# Patient Record
Sex: Male | Born: 1955 | Race: Black or African American | Hispanic: No | Marital: Single | Smoking: Former smoker
Health system: Southern US, Community
[De-identification: ages and names within clinical notes are randomized; demographics above are authoritative.]

## PROBLEM LIST (undated history)

## (undated) DIAGNOSIS — E785 Hyperlipidemia, unspecified: Secondary | ICD-10-CM

## (undated) DIAGNOSIS — I10 Essential (primary) hypertension: Secondary | ICD-10-CM

## (undated) DIAGNOSIS — J449 Chronic obstructive pulmonary disease, unspecified: Secondary | ICD-10-CM

## (undated) HISTORY — DX: Essential (primary) hypertension: I10

## (undated) HISTORY — DX: Chronic obstructive pulmonary disease, unspecified: J44.9

## (undated) HISTORY — DX: Hyperlipidemia, unspecified: E78.5

## (undated) HISTORY — PX: OTHER SURGICAL HISTORY: SHX169

---

## 1997-11-22 ENCOUNTER — Ambulatory Visit (HOSPITAL_COMMUNITY): Admission: RE | Admit: 1997-11-22 | Discharge: 1997-11-22 | Payer: Self-pay | Admitting: Endocrinology

## 2015-05-17 ENCOUNTER — Ambulatory Visit (HOSPITAL_COMMUNITY)
Admission: RE | Admit: 2015-05-17 | Discharge: 2015-05-17 | Disposition: A | Discharge status: Home / Self Care | Payer: Medicaid Other | Attending: Psychiatry | Admitting: Psychiatry

## 2015-05-17 DIAGNOSIS — F3289 Other specified depressive episodes: Secondary | ICD-10-CM | POA: Insufficient documentation

## 2015-05-17 DIAGNOSIS — F1910 Other psychoactive substance abuse, uncomplicated: Secondary | ICD-10-CM | POA: Insufficient documentation

## 2015-05-17 DIAGNOSIS — F419 Anxiety disorder, unspecified: Secondary | ICD-10-CM | POA: Diagnosis not present
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
## 2015-06-16 ENCOUNTER — Other Ambulatory Visit: Payer: Self-pay | Admitting: Licensed Clinical Social Worker

## 2015-06-23 ENCOUNTER — Ambulatory Visit (INDEPENDENT_AMBULATORY_CARE_PROVIDER_SITE_OTHER): Payer: Self-pay | Admitting: Licensed Clinical Social Worker

## 2015-06-23 DIAGNOSIS — F410 Panic disorder [episodic paroxysmal anxiety] without agoraphobia: Secondary | ICD-10-CM

## 2015-06-23 DIAGNOSIS — F329 Major depressive disorder, single episode, unspecified: Secondary | ICD-10-CM

## 2015-06-23 DIAGNOSIS — F32A Depression, unspecified: Secondary | ICD-10-CM

## 2015-06-23 NOTE — Progress Notes (Signed)
   THERAPY PROGRESS NOTE  Session Time: 60min  Participation Level: Active  Behavioral Response: CasualAlertEuthymic  Type of Therapy: Individual Therapy  Treatment Goals addressed: Coping  Interventions: Motivational Interviewing and Supportive  Summary: Glenn Allison is a 60 y.o. male who presents with a euthymic mood and appropriate affect. He reported that he is seeking counseling at this time because of long-term depression that has worsened over the past three months. 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 He expressed his sadness and grief around her death while simultaneously dealing with the stress of his relationship. Glenn Allison shared that he lost several jobs and girlfriends due to his heavy caretaking responsibilities. He reported that his house is being foreclosed on and that he is "basically homeless." He shared that about one month ago, he became so depressed and hopeless that he was planning to kill himself; his girlfriend intervened and [facility]. He was not held at the hospital, but seeing several of his family members there to support him made him realize that he did have a reason to live. He reported that currently he does not have any suicidal ideation. He endorsed symptoms including anhedonia, sudden weight loss (20 pounds in last 2 months), sleep disturbance, feelings of worthlessness and shame, fatigue, and problems concentrating. Glenn Allison shared that over the past year, he has also begun experiencing panic attacks up to 4 times per week. He described these as including hyperventilation, racing heart, lightheadedness, shaking, sweating, and fear. He reported that despite his difficult circumstances, he is feeling very hopeful about his future and wants to work on getting his life back together.    Suicidal/Homicidal: Nowithout intent/plan  Therapist  Response: LCSW utilized supportive counseling techniques throughout the session in order to validate emotions and encourage open expression of emotion. LCSW began the clinical assessment but was unable to finish due to time constraints.  Plan: Return again in 2 weeks.  Diagnosis: Axis I: See current hospital problem list    Axis II: No diagnosis    Nilda Simmeratosha Kemarion Abbey, LCSW 06/23/2015

## 2015-07-05 ENCOUNTER — Encounter: Payer: Self-pay | Admitting: Internal Medicine

## 2015-07-05 ENCOUNTER — Ambulatory Visit (INDEPENDENT_AMBULATORY_CARE_PROVIDER_SITE_OTHER): Payer: Self-pay | Admitting: Internal Medicine

## 2015-07-05 VITALS — BP 172/100 | HR 88 | Resp 20 | Ht 70.0 in | Wt 156.0 lb

## 2015-07-05 DIAGNOSIS — F410 Panic disorder [episodic paroxysmal anxiety] without agoraphobia: Secondary | ICD-10-CM

## 2015-07-05 DIAGNOSIS — J449 Chronic obstructive pulmonary disease, unspecified: Secondary | ICD-10-CM

## 2015-07-05 DIAGNOSIS — R5383 Other fatigue: Secondary | ICD-10-CM

## 2015-07-05 DIAGNOSIS — F32A Depression, unspecified: Secondary | ICD-10-CM | POA: Insufficient documentation

## 2015-07-05 DIAGNOSIS — I10 Essential (primary) hypertension: Secondary | ICD-10-CM

## 2015-07-05 DIAGNOSIS — F329 Major depressive disorder, single episode, unspecified: Secondary | ICD-10-CM

## 2015-07-05 DIAGNOSIS — F419 Anxiety disorder, unspecified: Secondary | ICD-10-CM

## 2015-07-05 MED ORDER — CITALOPRAM HYDROBROMIDE 10 MG PO TABS: 10.0000 mg | ORAL_TABLET | Freq: Every day | ORAL | Status: DC

## 2015-07-05 MED ORDER — ALBUTEROL SULFATE HFA 108 (90 BASE) MCG/ACT IN AERS: 2.0000 | INHALATION_SPRAY | Freq: Four times a day (QID) | RESPIRATORY_TRACT | Status: DC | PRN

## 2015-07-05 MED ORDER — FLUTICASONE PROPIONATE HFA 220 MCG/ACT IN AERO: 2.0000 | INHALATION_SPRAY | Freq: Two times a day (BID) | RESPIRATORY_TRACT | Status: DC

## 2015-07-05 MED ORDER — METOPROLOL TARTRATE 25 MG PO TABS: 25.0000 mg | ORAL_TABLET | Freq: Two times a day (BID) | ORAL | Status: DC

## 2015-07-05 MED ORDER — BUDESONIDE-FORMOTEROL FUMARATE 160-4.5 MCG/ACT IN AERO: 2.0000 | INHALATION_SPRAY | Freq: Two times a day (BID) | RESPIRATORY_TRACT | Status: DC
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
## 2015-07-05 NOTE — Patient Instructions (Signed)
Can google "advance directives, [place]"  And bring up form from Secretary of State. Print and fill out Or can go to "5 wishes"  Which is also in Spanish and fill out--this costs $5--perhaps easier to use. Designate a Medical Power of Attorney to speak for you if you are unable to speak for yourself when ill or injured  

## 2015-07-06 ENCOUNTER — Other Ambulatory Visit: Payer: Self-pay | Admitting: Internal Medicine

## 2015-07-06 ENCOUNTER — Telehealth: Payer: Self-pay | Admitting: Internal Medicine

## 2015-07-06 DIAGNOSIS — J449 Chronic obstructive pulmonary disease, unspecified: Secondary | ICD-10-CM

## 2015-07-06 LAB — CBC WITH DIFFERENTIAL/PLATELET
BASOS: 0 %
Basophils Absolute: 0 10*3/uL (ref 0.0–0.2)
EOS (ABSOLUTE): 0.1 10*3/uL (ref 0.0–0.4)
EOS: 1 %
HEMATOCRIT: 46.9 % (ref 37.5–51.0)
HEMOGLOBIN: 15.3 g/dL (ref 12.6–17.7)
IMMATURE GRANS (ABS): 0 10*3/uL (ref 0.0–0.1)
IMMATURE GRANULOCYTES: 0 %
LYMPHS: 26 %
Lymphocytes Absolute: 2.4 10*3/uL (ref 0.7–3.1)
MCH: 30 pg (ref 26.6–33.0)
MCHC: 32.6 g/dL (ref 31.5–35.7)
MCV: 92 fL (ref 79–97)
Monocytes Absolute: 0.8 10*3/uL (ref 0.1–0.9)
Monocytes: 9 %
NEUTROS PCT: 64 %
Neutrophils Absolute: 5.9 10*3/uL (ref 1.4–7.0)
Platelets: 336 10*3/uL (ref 150–379)
RBC: 5.1 x10E6/uL (ref 4.14–5.80)
RDW: 14.6 % (ref 12.3–15.4)
WBC: 9.2 10*3/uL (ref 3.4–10.8)

## 2015-07-06 LAB — COMPREHENSIVE METABOLIC PANEL
A/G RATIO: 1.7 (ref 1.2–2.2)
ALBUMIN: 4.4 g/dL (ref 3.5–5.5)
ALT: 14 IU/L (ref 0–44)
AST: 12 IU/L (ref 0–40)
Alkaline Phosphatase: 80 IU/L (ref 39–117)
BUN / CREAT RATIO: 17 (ref 9–20)
BUN: 14 mg/dL (ref 6–24)
Bilirubin Total: 0.6 mg/dL (ref 0.0–1.2)
CALCIUM: 10 mg/dL (ref 8.7–10.2)
CO2: 26 mmol/L (ref 18–29)
Chloride: 102 mmol/L (ref 96–106)
Creatinine, Ser: 0.84 mg/dL (ref 0.76–1.27)
GFR, EST AFRICAN AMERICAN: 112 mL/min/{1.73_m2} (ref >59)
GFR, EST NON AFRICAN AMERICAN: 97 mL/min/{1.73_m2} (ref >59)
GLOBULIN, TOTAL: 2.6 g/dL (ref 1.5–4.5)
Glucose: 87 mg/dL (ref 65–99)
POTASSIUM: 4.7 mmol/L (ref 3.5–5.2)
Sodium: 141 mmol/L (ref 134–144)
TOTAL PROTEIN: 7 g/dL (ref 6.0–8.5)

## 2015-07-06 LAB — TSH: TSH: 1.69 u[IU]/mL (ref 0.450–4.500)

## 2015-07-06 MED ORDER — BUDESONIDE-FORMOTEROL FUMARATE 160-4.5 MCG/ACT IN AERO: 2.0000 | INHALATION_SPRAY | Freq: Two times a day (BID) | RESPIRATORY_TRACT | Status: DC

## 2015-07-06 MED ORDER — ALBUTEROL SULFATE HFA 108 (90 BASE) MCG/ACT IN AERS: 2.0000 | INHALATION_SPRAY | Freq: Four times a day (QID) | RESPIRATORY_TRACT | Status: DC | PRN

## 2015-07-06 NOTE — Telephone Encounter (Signed)
[facility] to inform us patient had received inhaler samples from them yesterday but they just found out that patient was approved for Medicaid on 07/01/15 and [facility] would not be able to help [facility] any longer. Medicaid ID # [id number] L

## 2015-07-06 NOTE — Telephone Encounter (Signed)
Patient informed and would like to have [facility] to [facility] [address].

## 2015-07-07 ENCOUNTER — Other Ambulatory Visit: Payer: Self-pay | Admitting: Licensed Clinical Social Worker

## 2015-07-14 ENCOUNTER — Ambulatory Visit (INDEPENDENT_AMBULATORY_CARE_PROVIDER_SITE_OTHER): Payer: Medicaid Other | Admitting: Licensed Clinical Social Worker

## 2015-07-14 DIAGNOSIS — F410 Panic disorder [episodic paroxysmal anxiety] without agoraphobia: Secondary | ICD-10-CM

## 2015-07-14 DIAGNOSIS — F329 Major depressive disorder, single episode, unspecified: Secondary | ICD-10-CM | POA: Diagnosis not present

## 2015-07-14 DIAGNOSIS — F32A Depression, unspecified: Secondary | ICD-10-CM
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
## 2015-07-16 ENCOUNTER — Encounter: Payer: Self-pay | Admitting: Internal Medicine

## 2015-07-16 DIAGNOSIS — J449 Chronic obstructive pulmonary disease, unspecified: Secondary | ICD-10-CM | POA: Insufficient documentation

## 2015-07-18 ENCOUNTER — Ambulatory Visit (INDEPENDENT_AMBULATORY_CARE_PROVIDER_SITE_OTHER): Payer: Medicaid Other | Admitting: Internal Medicine

## 2015-07-18 ENCOUNTER — Encounter: Payer: Self-pay | Admitting: Internal Medicine

## 2015-07-18 VITALS — BP 158/94 | HR 72 | Resp 18 | Ht 70.0 in | Wt 157.5 lb

## 2015-07-18 DIAGNOSIS — J449 Chronic obstructive pulmonary disease, unspecified: Secondary | ICD-10-CM

## 2015-07-18 DIAGNOSIS — I10 Essential (primary) hypertension: Secondary | ICD-10-CM

## 2015-07-18 DIAGNOSIS — F329 Major depressive disorder, single episode, unspecified: Secondary | ICD-10-CM | POA: Diagnosis not present

## 2015-07-18 DIAGNOSIS — F32A Depression, unspecified: Secondary | ICD-10-CM

## 2015-07-18 DIAGNOSIS — F410 Panic disorder [episodic paroxysmal anxiety] without agoraphobia: Secondary | ICD-10-CM | POA: Diagnosis not present

## 2015-07-18 DIAGNOSIS — H547 Unspecified visual loss: Secondary | ICD-10-CM

## 2015-07-18 MED ORDER — CITALOPRAM HYDROBROMIDE 10 MG PO TABS: 10.0000 mg | ORAL_TABLET | Freq: Every day | ORAL | Status: DC

## 2015-07-18 MED ORDER — METOPROLOL TARTRATE 25 MG PO TABS
ORAL_TABLET | ORAL | Status: DC
Start: 2015-07-18 — End: 2015-09-14

## 2015-07-18 NOTE — Patient Instructions (Addendum)
Decrease Symbicort to 1 puff twice daily if doing well in 1 month or sooner if raspiness gets worse.  Gargle after using Symbicort.  Call in blood pressures in 2 weeks.

## 2015-07-18 NOTE — Progress Notes (Signed)
Subjective:    Patient ID: Glenn Allison, male    DOB: 1956/01/11, 60 y.o.   MRN: [id number]  HPI   1.  Panic Attacks/Depression:  Feeling much better.  No side effects with Citalopram.  [facility], Dr. Roselle Locus Aflatooni, changed from Hydroxyzine to Clonazepam 0.5 mg 1/2 tabs twice daily as needed.  Also, given a prescription for Citalopram 20 mg when he did not have the 10 mg bottle with him at the psych visit.    2. COPD:  Breathing significantly better.  Is a bit raspy with the Symbicort, though brushing teeth and tongue after use.  [facility] did not have the Flovent, but we were able to switch to Symbicort.   Questions about screening for lung cancer came up as patient getting ready to leave.    3.  Essential  Hypertension: Tolerating Metoprolol 25 mg twice daily well.  Getting similar bps as we did today at home.   Current outpatient prescriptions:  .  albuterol (PROVENTIL HFA;VENTOLIN HFA) 108 (90 Base) MCG/ACT inhaler, Inhale 2 puffs into the lungs every 6 (six) hours as needed for wheezing or shortness of breath., Disp: 1 Inhaler, Rfl: 2 .  budesonide-formoterol (SYMBICORT) 160-4.5 MCG/ACT inhaler, Inhale 2 puffs into the lungs 2 (two) times daily., Disp: 1 Inhaler, Rfl: 11 .  citalopram (CELEXA) 10 MG tablet, Take 1 tablet (10 mg total) by mouth daily., Disp: 30 tablet, Rfl: 2 .  clonazePAM (KLONOPIN) 0.5 MG tablet, Take 0.5 mg by mouth 2 (two) times daily as needed for anxiety., Disp: , Rfl:  .  metoprolol tartrate (LOPRESSOR) 25 MG tablet, Take 1 tablet (25 mg total) by mouth 2 (two) times daily., Disp: 60 tablet, Rfl: 11 .  hydrOXYzine (VISTARIL) 25 MG capsule, Take 25 mg by mouth 3 (three) times daily as needed for anxiety (at bedtime). Reported on 07/18/2015, Disp: , Rfl:    No Known Allergies    Review of Systems     Objective:   Physical Exam HEENT:  Some dental decay, throat and mouth without white exudate or erythema Neck:  Supple, No adenopathy. Chest:  CTA, but does have  barrel chest and decreased BS CV:  RRR without murmur or rub, radial pulses normal and equal LE:  No edema, DP pulses normal and equal       Assessment & Plan:  1.  Panic Disorder/Depression:  Continue on Citalopram at 10 mg.  Discussed utilizing Clonazepam only as needed twice daily rather than just using regularly.  Hopefully, can wean off as his SSRI becomes therapeutic. Follow up in 4 weeks to see if need to increase Citalopram.  2.  COPD:  This is where patient seems to be deriving significant benefit.  So much so that he has been able to increase his physical activity to a large degree.  If doing well in another 2 weeks, to wean his Symbicort dose to 1 puff twice daily.  To gargle after each use.  May decrease dose if his voice continues to worsen.  3.  Essential Hypertension:  Increase Metoprolol to 37.5 mg twice daily.  His partner to call in his blood pressures in a couple of weeks.  4.  History of tobacco abuse.  Quit just 3 years ago and smoked for about 40 years.  Need to get a better idea of how many packs per day before ordering screening CT.  5.  Decreased visual acuity:  They will let me know who he would like a referral, though  discussed he should be able to call and find out what optometrists take Medicaid.  6.  Dental concerns:  Did not give him the handout on dentists who take Medicaid.  Will send out.

## 2015-07-25 ENCOUNTER — Telehealth: Payer: Self-pay | Admitting: Internal Medicine

## 2015-07-25 DIAGNOSIS — H547 Unspecified visual loss: Secondary | ICD-10-CM

## 2015-07-25 NOTE — Telephone Encounter (Signed)
Patient called to inquire about a vision referral.  Wants to know if he can have the doctor order him a referral for vision.  Not seeing as well, other wise feels much better.

## 2015-07-28 NOTE — Telephone Encounter (Signed)
Patient came in for a follow up on 07/18/15 and medications were sent to pharmacy

## 2015-08-02 ENCOUNTER — Other Ambulatory Visit: Payer: No Typology Code available for payment source | Admitting: Licensed Clinical Social Worker

## 2015-08-02 ENCOUNTER — Encounter: Payer: Self-pay | Admitting: Internal Medicine

## 2015-08-03 ENCOUNTER — Telehealth: Payer: Self-pay | Admitting: Internal Medicine

## 2015-08-03 NOTE — Telephone Encounter (Signed)
Patient called requesting medication refill for Klonopin 0.5 mg tablet.

## 2015-08-05 ENCOUNTER — Telehealth: Payer: Self-pay | Admitting: Internal Medicine

## 2015-08-05 MED ORDER — CLONAZEPAM 0.5 MG PO TABS: 0.5000 mg | ORAL_TABLET | Freq: Two times a day (BID) | ORAL | Status: DC | PRN

## 2015-08-05 NOTE — Telephone Encounter (Signed)
Patient states he is no longer taking as much of the clonazepam unless it is a really bad day he will take half a tablet. He is no longer going to [facility] and only doing counseling with Natosha.

## 2015-08-05 NOTE — Telephone Encounter (Signed)
Rx ready for patient to pick up. Patient informed

## 2015-08-05 NOTE — Telephone Encounter (Signed)
Rx picked up by patient today. 

## 2015-08-11 ENCOUNTER — Other Ambulatory Visit: Payer: Medicaid Other | Admitting: Licensed Clinical Social Worker

## 2015-08-15 ENCOUNTER — Ambulatory Visit (INDEPENDENT_AMBULATORY_CARE_PROVIDER_SITE_OTHER): Payer: Medicaid Other | Admitting: Licensed Clinical Social Worker

## 2015-08-15 ENCOUNTER — Ambulatory Visit (INDEPENDENT_AMBULATORY_CARE_PROVIDER_SITE_OTHER): Payer: Medicaid Other | Admitting: Internal Medicine

## 2015-08-15 ENCOUNTER — Encounter: Payer: Self-pay | Admitting: Internal Medicine

## 2015-08-15 VITALS — BP 140/90 | HR 60 | Temp 98.3°F | Resp 20 | Ht 70.0 in | Wt 159.0 lb

## 2015-08-15 DIAGNOSIS — F410 Panic disorder [episodic paroxysmal anxiety] without agoraphobia: Secondary | ICD-10-CM | POA: Diagnosis not present

## 2015-08-15 DIAGNOSIS — F32A Depression, unspecified: Secondary | ICD-10-CM

## 2015-08-15 DIAGNOSIS — I10 Essential (primary) hypertension: Secondary | ICD-10-CM | POA: Diagnosis not present

## 2015-08-15 DIAGNOSIS — F4310 Post-traumatic stress disorder, unspecified: Secondary | ICD-10-CM | POA: Diagnosis not present

## 2015-08-15 DIAGNOSIS — F329 Major depressive disorder, single episode, unspecified: Secondary | ICD-10-CM | POA: Diagnosis not present

## 2015-08-15 MED ORDER — LISINOPRIL 5 MG PO TABS: 5.0000 mg | ORAL_TABLET | Freq: Every day | ORAL | Status: DC

## 2015-08-15 MED ORDER — CITALOPRAM HYDROBROMIDE 20 MG PO TABS: 20.0000 mg | ORAL_TABLET | Freq: Every day | ORAL | Status: DC

## 2015-08-15 NOTE — Progress Notes (Signed)
   Subjective:    Patient ID: Glenn Allison, male    DOB: 09-23-1955, 60 y.o.   MRN: [id number]  HPI   1.  Essential Hypertension:  Checking bps at home and running about the same as we obtain today.  Resting heart rate at home has been in 70s when bp checked.  2.  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   He has stopped going to [facility]. Using half tablet of Clonazepam when needed.  Sometimes using for sleep when anxious.   Has 8 tabs left.  3.  COPD:  Doing much better.  Harder times during hot muggy days.  He did decrease his does of the Symbicort to 1 puff twice daily due to changes in his voice.  He will increase to 2 puffs on days where it is really humid.  He is using rescue inhaler 3-4 times daily.  Not sure if some of it more related to anxiety.    Current outpatient prescriptions:  .  albuterol (PROVENTIL HFA;VENTOLIN HFA) 108 (90 Base) MCG/ACT inhaler, Inhale 2 puffs into the lungs every 6 (six) hours as needed for wheezing or shortness of breath., Disp: 1 Inhaler, Rfl: 2 .  budesonide-formoterol (SYMBICORT) 160-4.5 MCG/ACT inhaler, Inhale 2 puffs into the lungs 2 (two) times daily., Disp: 1 Inhaler, Rfl: 11 .  clonazePAM (KLONOPIN) 0.5 MG tablet, Take 1 tablet (0.5 mg total) by mouth 2 (two) times daily as needed for anxiety., Disp: 15 tablet, Rfl: 0 .  metoprolol tartrate (LOPRESSOR) 25 MG tablet, 1 1/2 tabs by mouth twice daily, Disp: 90 tablet, Rfl: 11 .  citalopram (CELEXA) 20 MG tablet, Take 1 tablet (20 mg total) by mouth daily., Disp: 30 tablet, Rfl: 11 .  lisinopril (PRINIVIL,ZESTRIL) 5 MG tablet, Take 1 tablet (5 mg total) by mouth daily., Disp: 30 tablet, Rfl: 11    No Known Allergies       Review of Systems     Objective:   Physical Exam Lungs:  Lungs CTA CV:  RRR with rare skipped beat.  No murmur or rub, radial and DPpulses normal and  equal. LE:  No edema        Assessment & Plan:  1.  Essential Hypertension: Not quite at goal. Resting pulse limits increase of Metoprolol.  Add low dose Lisinopril and check BMP/BP in 1 week. Follow up with me in 1 month  2.  Anxiety/panic disorder/Depression:  INcrease Citalopram to 20 mg daily.  Follow up in 1 month. He will call if the remaining Clonazepam is not enough. To continue to work with Samul DadaN. Knight, LCSW, but will look into Psychiatrist who is focused on PTSD treatment and takes Medicaid. He will call and let me know whether to proceed with referral after speaking with Samul DadaN. Knight, LCSW, today.  3.  COPD:  Much improved.  Knows not to increase dosage of Symbicort above 2 puffs twice daily.

## 2015-08-16 NOTE — Progress Notes (Signed)
   THERAPY PROGRESS NOTE  Session Time: 30min  Participation Level: Active  Behavioral Response: Casual and Well GroomedAlertAnxious  Type of Therapy: Individual Therapy  Treatment Goals addressed: Anxiety  Interventions: Supportive and Meditation: Guided imagery  Summary: Glenn Allison is a 60 y.o. male who presents with an anxious mood and appropriate affect. He shared that he has been thinking about finding a new psychiatrist and/or therapist for PTSD issues related to his [employer] service. Glenn Favrescar and LCSW discussed options for his treatment. He decided that he will stay with [facility] and will continue counseling with LCSW to focus on PTSD; LCSW will refer him to another provider if he feels that treatment is not effective. Glenn FavreOscar shared that he has coped with his trauma by being a joking, laughing "fun guy" but that inside, he feels he has a black heart from what he has been through. He stated that it was becoming too much for him. He shared about his new job, which he enjoys, but which has also increased his anxiety. He participated in guided imagery focused on creating a safe space in his mind and creating a "safe box" to store his upsetting memories. He shared that he really enjoyed the exercise and that he would practice using his safe space and safe box starting later that day.   Suicidal/Homicidal: Nowithout intent/plan  Therapist Response: LCSW utilized supportive counseling techniques throughout the session in order to validate emotions and encourage open expression of emotion. LCSW engaged in a guided imagery activity focused on containment of trauma, including deep breathing and progressive muscle relaxation. LCSW normalized [name]'s statement that his "brain is crazy" by providing psychoeducation about how the brain stores traumatic memories. LCSW shared that she is not a specialist in PTSD but has had training and will be getting additional training in August; LCSW  offered to continue counseling with Glenn Favrescar or refer him out to a specialist if he prefers.  Plan: Return again in 2 weeks.  Diagnosis: Axis I: See current hospital problem list    Axis II: No diagnosis    Nilda Simmeratosha Carsynn Bethune, LCSW 08/16/2015

## 2015-08-22 ENCOUNTER — Other Ambulatory Visit: Payer: Medicaid Other

## 2015-08-25 ENCOUNTER — Other Ambulatory Visit: Payer: Medicaid Other

## 2015-08-29 ENCOUNTER — Telehealth: Payer: Self-pay | Admitting: Internal Medicine

## 2015-08-29 NOTE — Telephone Encounter (Signed)
Patient called and is having a reaction to Rx lisinopril.  The medication is causing low pulse and feeling tired.  Please advise.

## 2015-08-30 NOTE — Telephone Encounter (Signed)
Please call and find out what his pulse is running. Also, if they have some BP readings. If his pulse is low, it is likely the Metoprolol, not the Lisinopril.

## 2015-08-30 NOTE — Telephone Encounter (Signed)
I do not believe his symptoms are from the Lisinopril.  The MEtoprolol is the medication that will decrease his heart rate, not the Lisinopril.   It also sounds like he may have a viral bug he is fighting.   I would like him to continue with the Lisinopril and see if his diarrhea and fatigue resolve regardless.   If he would like to come in for a bp check and pulse, that would be fine as well.  We can decide after that if we need to decrease the Metoprolol, but would like to check bp and pulse here before deciding to make a change with meds.

## 2015-08-30 NOTE — Telephone Encounter (Signed)
His Heart rates were 52 yesterday and 56 today 150/100 was his blood pressure yesterday. The patient states he started the lisinopril on Friday and started feeling sick on Sunday. He says he has been very fatigued and has had diarrhea. He decided not to take the medication today. Please advise.

## 2015-09-01 ENCOUNTER — Other Ambulatory Visit: Payer: Self-pay

## 2015-09-01 DIAGNOSIS — F410 Panic disorder [episodic paroxysmal anxiety] without agoraphobia: Secondary | ICD-10-CM

## 2015-09-02 ENCOUNTER — Other Ambulatory Visit: Payer: Medicaid Other | Admitting: Licensed Clinical Social Worker

## 2015-09-02 ENCOUNTER — Ambulatory Visit: Payer: Medicaid Other

## 2015-09-02 ENCOUNTER — Other Ambulatory Visit: Payer: Medicaid Other | Admitting: Internal Medicine

## 2015-09-02 VITALS — BP 160/90 | HR 62

## 2015-09-02 DIAGNOSIS — I10 Essential (primary) hypertension: Secondary | ICD-10-CM

## 2015-09-02 MED ORDER — CLONAZEPAM 0.5 MG PO TABS: 0.5000 mg | ORAL_TABLET | Freq: Two times a day (BID) | ORAL | Status: DC | PRN

## 2015-09-02 NOTE — Telephone Encounter (Signed)
See BP check encounter from today. Patient has restarted lisinopril today and had diarrhea again today again since starting.

## 2015-09-02 NOTE — Telephone Encounter (Signed)
Wrote for [employer].  He was not due to pick up until next Monday.  He may pick up then

## 2015-09-02 NOTE — Progress Notes (Signed)
Patient restarted lisinopril today. The diarrhea returned today as well. He is concerned that this may not be a medication he can tolerate because he feels "listless" when taking it.

## 2015-09-09 ENCOUNTER — Ambulatory Visit (INDEPENDENT_AMBULATORY_CARE_PROVIDER_SITE_OTHER): Payer: Medicaid Other | Admitting: Licensed Clinical Social Worker

## 2015-09-09 DIAGNOSIS — F329 Major depressive disorder, single episode, unspecified: Secondary | ICD-10-CM | POA: Diagnosis not present

## 2015-09-09 DIAGNOSIS — F32A Depression, unspecified: Secondary | ICD-10-CM

## 2015-09-09 NOTE — Telephone Encounter (Signed)
Patient scheduled for 09-14-15

## 2015-09-09 NOTE — Progress Notes (Signed)
   THERAPY PROGRESS NOTE  Session Time: 60min  Participation Level: Active  Behavioral Response: Casual and Well GroomedAlertEuthymic  Type of Therapy: Individual Therapy  Treatment Goals addressed: Coping  Interventions: Supportive  Summary: Glenn BullionOscar E Allison is a 60 y.o. male who presents with a positive mood and appropriate affect. 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 He reported that he is feeling better about himself than he has in years. He shared his frustrations regarding his girlfriend's increasing jealousy; as he feels better, his girlfriend is more insecure and jealous. He shared about the ways that she pushes him. He appeared receptive to LCSW feedback about the need to reflect seriously on their relationship and what he wants. Glenn Allison shared that he is prioritizing his own care for the first time in a long time and that he does not want to backslide. He shared about the ways that he has "taken crap" from his girlfriend in their relationship. He described her jealousy as "poison." Glenn Allison shared about his relationship with his brother, which is currently very positive. He emphasized that he is ready to have a good, peaceful life despite his past. Glenn Allison shared about being with his best friend when he died.    Suicidal/Homicidal: Nowithout intent/plan  Therapist Response: LCSW utilized supportive counseling techniques throughout the session in order to validate emotions and encourage open expression of emotion. LCSW and Glenn Allison processed about his current relationship and its stressors. LCSW encouraged Glenn Allison to talk seriously with his girlfriend about healthy relationship habits and boundaries. LCSW provided affirmations to [facility] for taking good care of himself and prioritizing self-care.  Plan: Return again in 3 weeks.  Diagnosis: Axis I: See current hospital problem  list    Axis II: No diagnosis    Glenn Simmeratosha Abrar Bilton, LCSW 09/09/2015

## 2015-09-14 ENCOUNTER — Ambulatory Visit (INDEPENDENT_AMBULATORY_CARE_PROVIDER_SITE_OTHER): Payer: Medicaid Other | Admitting: Internal Medicine

## 2015-09-14 ENCOUNTER — Encounter: Payer: Self-pay | Admitting: Internal Medicine

## 2015-09-14 VITALS — BP 150/98 | HR 62 | Temp 98.4°F | Resp 18 | Ht 70.25 in | Wt 159.0 lb

## 2015-09-14 DIAGNOSIS — F329 Major depressive disorder, single episode, unspecified: Secondary | ICD-10-CM

## 2015-09-14 DIAGNOSIS — J449 Chronic obstructive pulmonary disease, unspecified: Secondary | ICD-10-CM | POA: Diagnosis not present

## 2015-09-14 DIAGNOSIS — F410 Panic disorder [episodic paroxysmal anxiety] without agoraphobia: Secondary | ICD-10-CM

## 2015-09-14 DIAGNOSIS — H9311 Tinnitus, right ear: Secondary | ICD-10-CM | POA: Diagnosis not present

## 2015-09-14 DIAGNOSIS — I10 Essential (primary) hypertension: Secondary | ICD-10-CM

## 2015-09-14 DIAGNOSIS — H547 Unspecified visual loss: Secondary | ICD-10-CM | POA: Diagnosis not present

## 2015-09-14 DIAGNOSIS — H9193 Unspecified hearing loss, bilateral: Secondary | ICD-10-CM | POA: Diagnosis not present

## 2015-09-14 DIAGNOSIS — F32A Depression, unspecified: Secondary | ICD-10-CM

## 2015-09-14 MED ORDER — METOPROLOL SUCCINATE ER 50 MG PO TB24: 50.0000 mg | ORAL_TABLET | Freq: Every day | ORAL | Status: DC

## 2015-09-14 MED ORDER — AMLODIPINE BESYLATE 2.5 MG PO TABS: 2.5000 mg | ORAL_TABLET | Freq: Every day | ORAL | Status: DC

## 2015-09-14 NOTE — Progress Notes (Signed)
Subjective:    Patient ID: Glenn Allison, male    DOB: 04-29-1955, 60 y.o.   MRN: [id number] HPI   1.  Essential Hypertension:  Feels like he has diarrhea due to his Lisinopril.  Last took the medication 2 days ago.  Having 2-3 runny stools daily.  No blood in stool.  He stopped the med sometime back and his loose stools stopped.  Just states he does not feel as well on the medication. Has not been on any other bp med besides Metoprolol in past.  Is not getting light headed when up and about, despite resting heart rate.   2.  Right Ear:  Intermittent low rumble.  First noticed 1 1/2 months ago.  Having once to twice weekly.  Can last hours, but generally at least 1 hour.  No loss of hearing at the time of the noise, though his partner feels he does have long term hearing loss due to being around guns and amplifiers at younger ages.   No popping of ears, no sense of the noise being regular, like his pulse.  No sense of sinus or ear, throat congestion.  3.  Depression/anxiety: Anxiety is better,but can still have difficulties in new situations.  He does feel his depression is much better.    States he is not sure if at times episodes of dyspnea are due to COPD or due to anxiety.   4.  COPD:  Continues not to smoke.  Breathing in general, much better.  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       Current outpatient prescriptions:  .  albuterol (PROVENTIL HFA;VENTOLIN HFA) 108 (90 Base) MCG/ACT inhaler, Inhale 2 puffs into the lungs every 6 (six) hours as needed for wheezing or shortness of breath., Disp: 1 Inhaler, Rfl: 2 .  budesonide-formoterol (SYMBICORT) 160-4.5 MCG/ACT inhaler, Inhale 2 puffs into the lungs 2 (two) times daily., Disp: 1 Inhaler, Rfl: 11 .  citalopram (CELEXA) 20 MG tablet, Take 1 tablet (20 mg total) by mouth daily., Disp: 30 tablet, Rfl: 11 .  clonazePAM (KLONOPIN) 0.5 MG  tablet, Take 1 tablet (0.5 mg total) by mouth 2 (two) times daily as needed for anxiety., Disp: 15 tablet, Rfl: 0 .  metoprolol tartrate (LOPRESSOR) 25 MG tablet, 1 1/2 tabs by mouth twice daily, Disp: 90 tablet, Rfl: 11 .  lisinopril (PRINIVIL,ZESTRIL) 5 MG tablet, Take 1 tablet (5 mg total) by mouth daily. (Patient not taking: Reported on 09/14/2015), Disp: 30 tablet, Rfl: 11   No Known Allergies  Review of Systems     Objective:   Physical Exam HEENT:  TMs pearly gray, NT over mastoids, nasal mucosa without swelling, no nasal discharge, no throat injection or swelling. Neck:  Supple, No adenopathy Chest:  CTA CV:  RRR without murmur or rub.  Radial pulses normal and equal. HR up to 62 after jumping in place for 15 seconds--not a significant effort for this. LE:  No edema      Assessment & Plan:  1.  Essential Hypertension:  Stop Lisinopril as continues to have loose stools he feels are related to this medication use.  Switch to 24 hour Metoprolol 50 mg daily.   Add Amlodipine 2.5 mg daily. May take the bp meds at opposite ends of day or at same time, however he best tolerates.  2.  ?Tinnitus, but appears to have hearing loss:  Audiology referral to [facility].  3.  COPD:  Stable.  CPM  4.  Depression:  Seems to be much better controlled on 20 mg of Citalopram  5.  Anxiety:  Discussed will ultimately want to stop the Clonazepam.  To continue to work with Samul DadaN.  Knight on this.  6.  Decreased Vision:  Needs optometry referral.

## 2015-09-14 NOTE — Patient Instructions (Signed)
Call in bps taken twice weekly over next 2 weeks.   Also, call in pulse with these reports as well, especially after physical activity

## 2015-09-30 ENCOUNTER — Ambulatory Visit (INDEPENDENT_AMBULATORY_CARE_PROVIDER_SITE_OTHER): Payer: Self-pay | Admitting: Licensed Clinical Social Worker

## 2015-09-30 DIAGNOSIS — F32A Depression, unspecified: Secondary | ICD-10-CM

## 2015-09-30 DIAGNOSIS — F4310 Post-traumatic stress disorder, unspecified: Secondary | ICD-10-CM

## 2015-09-30 DIAGNOSIS — F329 Major depressive disorder, single episode, unspecified: Secondary | ICD-10-CM

## 2015-09-30 NOTE — Progress Notes (Signed)
   THERAPY PROGRESS NOTE  Session Time: 60min  Participation Level: Active  Behavioral Response: Neat and Well GroomedAlertEuthymic  Type of Therapy: Individual Therapy  Treatment Goals addressed: Coping  Interventions: Supportive  Summary: Glenn Allison is a 60 y.o. male who presents with a positive mood and appropriate affect. He reported that he has been feeling empowered recently by a promotion at his new job. He shared that his girlfriend has continued to be jealous, controlling, and insecure. He shared about the ways that her behaviors have impacted him emotionally. He expressed that he is on the verge of ending the relationship because it is no longer working. He shared his sadness that the relationship can't work despite their love. Glenn Allison shared that he is still considering doing the trauma work discussed in a previous session but feels scared to say it out loud. He expressed concerns about how LCSW would react or see him. 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 He shared about the ways that he has "made his heart right" since being in the [employer]. He stated that he is a completely different person now, and that he is not capable of doing violence of any kind. He reviewed the 5-part trauma narrative and agreed to think about it for the next session.   Suicidal/Homicidal: Nowithout intent/plan  Therapist Response: LCSW utilized supportive counseling techniques throughout the session in order to validate emotions and encourage open expression of emotion. LCSW showed Glenn Allison the 5-part trauma narrative intervention and explained how it helps to re-process trauma. LCSW encouraged Glenn Allison to think about whether/when he is ready to begin the work. LCSW and Glenn Allison reviewed what coping skills he is currently using.  Plan: Return again in 4 weeks.  Diagnosis: Axis I: See current hospital problem list    Axis II: No diagnosis    Nilda Simmeratosha  Willett Lefeber, LCSW 09/30/2015

## 2015-10-14 ENCOUNTER — Encounter: Payer: Self-pay | Admitting: Internal Medicine

## 2015-10-14 ENCOUNTER — Ambulatory Visit (INDEPENDENT_AMBULATORY_CARE_PROVIDER_SITE_OTHER): Payer: Medicaid Other | Admitting: Internal Medicine

## 2015-10-14 VITALS — BP 156/98 | HR 62 | Resp 16 | Ht 70.25 in | Wt 158.0 lb

## 2015-10-14 DIAGNOSIS — I10 Essential (primary) hypertension: Secondary | ICD-10-CM

## 2015-10-14 DIAGNOSIS — F32A Depression, unspecified: Secondary | ICD-10-CM

## 2015-10-14 DIAGNOSIS — F329 Major depressive disorder, single episode, unspecified: Secondary | ICD-10-CM | POA: Diagnosis not present

## 2015-10-14 MED ORDER — CLONAZEPAM 0.5 MG PO TABS: 0.5000 mg | ORAL_TABLET | Freq: Two times a day (BID) | ORAL | 0 refills | Status: DC | PRN

## 2015-10-14 MED ORDER — AMLODIPINE BESYLATE 5 MG PO TABS: 2.5000 mg | ORAL_TABLET | Freq: Every day | ORAL | 11 refills | Status: DC

## 2015-10-14 NOTE — Progress Notes (Signed)
   Subjective:    Patient ID: Glenn Allison, male    DOB: November 20, 1955, 60 y.o.   MRN: [id number]  HPI   1.  Essential Hypertension:  Tolerating low dose Amlodipine 2.5 mg and 24 hour Metoprolol.   Did not bring bps from home as requested.  Did have one bp check that was in 148/70 range.   No diarrhea now, though not exactly back to his baseline of well formed stools all the time. No swelling of ankles.  2.  Anxiety and depression: New job and back to having difficulty with insomnia and increased anxiety with start of new job.  Would like refill of Clonazepam.  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  3.  Hearing concerns: Checked at [facility] and was fine.  4.  Viision:  Needs glasses. Planning to check with Medicaid.  Will have insurance.  Current Meds  Medication Sig  . albuterol (PROVENTIL HFA;VENTOLIN HFA) 108 (90 Base) MCG/ACT inhaler Inhale 2 puffs into the lungs every 6 (six) hours as needed for wheezing or shortness of breath.  Marland Kitchen amLODipine (NORVASC) 2.5 MG tablet Take 1 tablet (2.5 mg total) by mouth daily.  . budesonide-formoterol (SYMBICORT) 160-4.5 MCG/ACT inhaler Inhale 2 puffs into the lungs 2 (two) times daily.  . citalopram (CELEXA) 20 MG tablet Take 1 tablet (20 mg total) by mouth daily.  . clonazePAM (KLONOPIN) 0.5 MG tablet Take 1 tablet (0.5 mg total) by mouth 2 (two) times daily as needed for anxiety.  . metoprolol succinate (TOPROL-XL) 50 MG 24 hr tablet Take 1 tablet (50 mg total) by mouth daily. Take with or immediately following a meal.   No Known Allergies  Review of Systems     Objective:   Physical Exam  NAD Lungs:  CTA CV:  RRR with normal S1 and S2, NO S3, S4 or murmur, radial and DP pulses normal and equal.  No LE edema Abd:  S, NT, No HSM or mass, + BS        Assessment & Plan:  1.  Essential Hypertension:  Patient feels he is anxious when in the office.  Will have him check BPs at home and call into the clinic in the next  2-4 weeks.  If BPs still high, will likely increase Amlodipine.  2.  Anxiety and depression:  Continue Citlopram.  Will extend use of Clonazepam as he is starting a new job, but would like to discontinue use in near future.

## 2015-10-14 NOTE — Patient Instructions (Signed)
Call in BP and Pulses --please check a couple times weekly--in 2-4 weeks please. If still running above 140/90, will likely need to increase Amlodipine

## 2015-10-28 ENCOUNTER — Other Ambulatory Visit: Payer: Medicaid Other | Admitting: Licensed Clinical Social Worker

## 2015-12-07 ENCOUNTER — Telehealth: Payer: Self-pay | Admitting: Internal Medicine

## 2015-12-07 MED ORDER — CLONAZEPAM 0.5 MG PO TABS: 0.5000 mg | ORAL_TABLET | Freq: Two times a day (BID) | ORAL | 0 refills | Status: DC | PRN

## 2015-12-08 NOTE — Telephone Encounter (Signed)
Pt contacted.

## 2015-12-19 ENCOUNTER — Encounter: Payer: Self-pay | Admitting: Internal Medicine

## 2016-01-17 ENCOUNTER — Ambulatory Visit: Payer: Medicaid Other | Admitting: Internal Medicine

## 2016-01-17 DIAGNOSIS — I10 Essential (primary) hypertension: Secondary | ICD-10-CM

## 2016-01-17 MED ORDER — AMLODIPINE BESYLATE 5 MG PO TABS
5.0000 mg | ORAL_TABLET | Freq: Every day | ORAL | 11 refills | Status: DC
Start: 2016-01-17 — End: 2016-10-18

## 2016-01-17 MED ORDER — CLONAZEPAM 0.5 MG PO TABS: 0.5000 mg | ORAL_TABLET | Freq: Two times a day (BID) | ORAL | 0 refills | Status: DC | PRN

## 2016-01-17 NOTE — Progress Notes (Signed)
Patient is stressed, but bp not at goal in past visits as well. Increase Amlodipine to 5 mg and return for bp check in 2 weeks

## 2016-01-26 ENCOUNTER — Other Ambulatory Visit: Payer: Self-pay

## 2016-01-26 ENCOUNTER — Telehealth: Payer: Self-pay

## 2016-01-26 DIAGNOSIS — J449 Chronic obstructive pulmonary disease, unspecified: Secondary | ICD-10-CM

## 2016-01-26 MED ORDER — ALBUTEROL SULFATE HFA 108 (90 BASE) MCG/ACT IN AERS: 2.0000 | INHALATION_SPRAY | Freq: Four times a day (QID) | RESPIRATORY_TRACT | 2 refills | Status: DC | PRN

## 2016-01-26 NOTE — Telephone Encounter (Signed)
Spoke with Dr. Delrae AlfredMulberry ok to refill albuterol. Patient has appointment on 01/31/16. Rx [facility] to [facility].

## 2016-01-27 NOTE — Telephone Encounter (Signed)
[facility]

## 2016-03-13 ENCOUNTER — Telehealth: Payer: Self-pay | Admitting: Internal Medicine

## 2016-03-13 NOTE — Telephone Encounter (Signed)
Patient needs refill of clonazePAM and would also like a referral to a pulmonary provider.

## 2016-03-14 NOTE — Telephone Encounter (Signed)
To Dr. Mulberry for approval 

## 2016-03-22 ENCOUNTER — Other Ambulatory Visit: Payer: Self-pay | Admitting: Internal Medicine

## 2016-03-22 MED ORDER — CLONAZEPAM 0.5 MG PO TABS: 0.5000 mg | ORAL_TABLET | Freq: Two times a day (BID) | ORAL | 0 refills | Status: DC | PRN

## 2016-03-22 NOTE — Progress Notes (Signed)
Spoke with patient. Informed he can come and pick up Rx and he needs to schedule appointment with Dr. Durwin RegesMulbeery. Patient states he will schedule appointment when he comes in to pick up Rx.

## 2016-04-03 ENCOUNTER — Telehealth: Payer: Self-pay | Admitting: Internal Medicine

## 2016-04-03 NOTE — Telephone Encounter (Signed)
Appointment made

## 2016-04-03 NOTE — Telephone Encounter (Signed)
Spoke with patient informed he needs to come back in and be re-evaluated by Dr. Delrae AlfredMulberry. Patient transferred to front desk to schedule appointment.

## 2016-04-03 NOTE — Telephone Encounter (Signed)
Patient wants to know if he needs to see a Cardiologist or English as a second language teacherlumonary Doctor.  Problems breathing when walking or carrying things.  Heart rate increases.  Please advise, not sure if inhalers are  working for him now.

## 2016-04-06 ENCOUNTER — Telehealth: Payer: Self-pay | Admitting: Internal Medicine

## 2016-04-06 NOTE — Telephone Encounter (Signed)
Spoke with patient. Informed he needs to be seen by Dr. Delrae AlfredMulberry to evaluate symptoms. Patient states he does not have transportation to get. States he usually borrows his brothers car but he is busy today. Patient is going to call back and let us know if he can come in. He would also like to know what is his alternative if he cant get a ride to come in.

## 2016-04-06 NOTE — Telephone Encounter (Signed)
Spoke with patient states he was unable to get ride to come in. States he is having fever of 100, chills, chest congestion, fatigue and body aches. Patient states he just needs something to get over his symptoms. Dr. Delrae AlfredMulberry took over phone call.

## 2016-04-06 NOTE — Telephone Encounter (Signed)
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 Would like Dr. Delrae AlfredMulberry to send a prescription to the pharmacy if possible.

## 2016-04-07 NOTE — Telephone Encounter (Signed)
Spoke with patient following CMA on same phone call yesterday. His blood pressure is still borderline based on home checks.   Encouraged OTC multisystem cold/flu remedies without decongestant to avoid increasing bp. Also, to not take tylenol or ibuprofen extra if already contained in the multisystem cold remedy. We tried throughout the day to have him come in for evaluation, but he was unable to get in.

## 2016-04-20 ENCOUNTER — Encounter: Payer: Self-pay | Admitting: Internal Medicine

## 2016-04-20 ENCOUNTER — Ambulatory Visit (INDEPENDENT_AMBULATORY_CARE_PROVIDER_SITE_OTHER): Payer: Medicaid Other | Admitting: Internal Medicine

## 2016-04-20 VITALS — BP 150/100 | HR 70 | Resp 12 | Ht 70.25 in | Wt 174.0 lb

## 2016-04-20 DIAGNOSIS — J449 Chronic obstructive pulmonary disease, unspecified: Secondary | ICD-10-CM | POA: Diagnosis not present

## 2016-04-20 DIAGNOSIS — I10 Essential (primary) hypertension: Secondary | ICD-10-CM | POA: Diagnosis not present

## 2016-04-20 DIAGNOSIS — R0609 Other forms of dyspnea: Secondary | ICD-10-CM

## 2016-04-20 MED ORDER — ASPIRIN EC 81 MG PO TBEC: 81.0000 mg | DELAYED_RELEASE_TABLET | Freq: Every day | ORAL | Status: AC

## 2016-04-20 MED ORDER — METOPROLOL SUCCINATE ER 100 MG PO TB24: 100.0000 mg | ORAL_TABLET | Freq: Every day | ORAL | 11 refills | Status: DC

## 2016-04-20 MED ORDER — NITROGLYCERIN 0.4 MG SL SUBL: SUBLINGUAL_TABLET | SUBLINGUAL | 1 refills | Status: DC

## 2016-04-20 NOTE — Progress Notes (Signed)
   Subjective:    Patient ID: Glenn Allison, male    DOB: Oct 29, 1955, 61 y.o.   MRN: [id number]  HPI   1.  Dyspnea:  States worse in past 3 months.  Not to level of what he had when first established and was not on medication, however.  States if he is carrying 2 bags of grocery bags from driveway into kitchen, he will be quite short of breath.   Is taking all his COPD meds regularly.   Not missing BP meds.   No definite chest pain or chest pressure, more of a sense he cannot adequately expand chest or a chest tightness.  Can last 5-10 minutes.   No associated neck, jaw discomfort or radiation to back. No PND or orthopnea symptoms.  No LE edema   He is not checking bp at home.   Last time he checked bp at home when he felt calm, his bp was 145/90. Not taking an aspirin every day.  Not smoking.  2.  Essential Hypertension:  See history above.  Current Meds  Medication Sig  . albuterol (PROVENTIL HFA;VENTOLIN HFA) 108 (90 Base) MCG/ACT inhaler Inhale 2 puffs into the lungs every 6 (six) hours as needed for wheezing or shortness of breath.  Marland Kitchen. amLODipine (NORVASC) 5 MG tablet Take 1 tablet (5 mg total) by mouth daily.  . budesonide-formoterol (SYMBICORT) 160-4.5 MCG/ACT inhaler Inhale 2 puffs into the lungs 2 (two) times daily.  . citalopram (CELEXA) 20 MG tablet Take 1 tablet (20 mg total) by mouth daily.  . metoprolol succinate (TOPROL-XL) 100 MG 24 hr tablet Take 1 tablet (100 mg total) by mouth daily. Take with or immediately following a meal.  . [DISCONTINUED] metoprolol succinate (TOPROL-XL) 50 MG 24 hr tablet Take 1 tablet (50 mg total) by mouth daily. Take with or immediately following a meal.    No Known Allergies    Review of Systems     Objective:   Physical Exam  NAD Neck:  No JVD Lungs:  CTA with good air movement. CV:  RRR with normal S1 and S2, No S3, S4 or murmur.  No carotid bruits radial and DP pulses normal and equal Abd:  S, NT, No HSM or masses, + BS LE:   No edema  ECG:  Sinus bradycardia, otherwise normal      Assessment & Plan:  Increasing exertional dyspnea:   Referral to Dr. Verdis PrimeHenry Smith, Cardiology To return for FLP, CBC, CMP Increase Metoprolol to 100 mg daily NTG SL as needed Continue COPD meds If no findings from cardiac standpoint, will evaluate respiratory.

## 2016-04-27 ENCOUNTER — Other Ambulatory Visit: Payer: Medicaid Other

## 2016-04-27 VITALS — BP 138/88 | HR 66

## 2016-04-27 DIAGNOSIS — R0609 Other forms of dyspnea: Principal | ICD-10-CM

## 2016-04-28 LAB — CBC WITH DIFFERENTIAL/PLATELET
BASOS ABS: 0 10*3/uL (ref 0.0–0.2)
BASOS: 0 %
EOS (ABSOLUTE): 0.1 10*3/uL (ref 0.0–0.4)
Eos: 1 %
Hematocrit: 45.7 % (ref 37.5–51.0)
Hemoglobin: 15.3 g/dL (ref 13.0–17.7)
IMMATURE GRANS (ABS): 0 10*3/uL (ref 0.0–0.1)
IMMATURE GRANULOCYTES: 0 %
LYMPHS: 26 %
Lymphocytes Absolute: 2.1 10*3/uL (ref 0.7–3.1)
MCH: 31.1 pg (ref 26.6–33.0)
MCHC: 33.5 g/dL (ref 31.5–35.7)
MCV: 93 fL (ref 79–97)
MONOS ABS: 0.6 10*3/uL (ref 0.1–0.9)
Monocytes: 8 %
NEUTROS PCT: 65 %
Neutrophils Absolute: 5.3 10*3/uL (ref 1.4–7.0)
PLATELETS: 265 10*3/uL (ref 150–379)
RBC: 4.92 x10E6/uL (ref 4.14–5.80)
RDW: 14.4 % (ref 12.3–15.4)
WBC: 8.2 10*3/uL (ref 3.4–10.8)

## 2016-04-28 LAB — COMPREHENSIVE METABOLIC PANEL
ALT: 18 IU/L (ref 0–44)
AST: 19 IU/L (ref 0–40)
Albumin/Globulin Ratio: 1.6 (ref 1.2–2.2)
Albumin: 4.4 g/dL (ref 3.5–5.5)
Alkaline Phosphatase: 72 IU/L (ref 39–117)
BUN/Creatinine Ratio: 10 (ref 9–20)
BUN: 10 mg/dL (ref 6–24)
Bilirubin Total: 1.1 mg/dL (ref 0.0–1.2)
CALCIUM: 9.7 mg/dL (ref 8.7–10.2)
CHLORIDE: 101 mmol/L (ref 96–106)
CO2: 26 mmol/L (ref 18–29)
Creatinine, Ser: 0.97 mg/dL (ref 0.76–1.27)
GFR, EST AFRICAN AMERICAN: 98 mL/min/{1.73_m2} (ref >59)
GFR, EST NON AFRICAN AMERICAN: 85 mL/min/{1.73_m2} (ref >59)
GLUCOSE: 92 mg/dL (ref 65–99)
Globulin, Total: 2.8 g/dL (ref 1.5–4.5)
POTASSIUM: 5.1 mmol/L (ref 3.5–5.2)
Sodium: 139 mmol/L (ref 134–144)
TOTAL PROTEIN: 7.2 g/dL (ref 6.0–8.5)

## 2016-04-28 LAB — LIPID PANEL W/O CHOL/HDL RATIO
Cholesterol, Total: 231 mg/dL — ABNORMAL HIGH (ref 100–199)
HDL: 72 mg/dL (ref >39)
LDL Calculated: 146 mg/dL — ABNORMAL HIGH (ref 0–99)
TRIGLYCERIDES: 66 mg/dL (ref 0–149)
VLDL CHOLESTEROL CAL: 13 mg/dL (ref 5–40)

## 2016-05-02 ENCOUNTER — Telehealth: Payer: Self-pay | Admitting: Internal Medicine

## 2016-05-02 NOTE — Telephone Encounter (Signed)
Patient left voicemail stating he wants someone to contact him to follow up on Cardiology referral

## 2016-05-09 ENCOUNTER — Ambulatory Visit (INDEPENDENT_AMBULATORY_CARE_PROVIDER_SITE_OTHER): Payer: Medicaid Other | Admitting: Internal Medicine

## 2016-05-09 ENCOUNTER — Ambulatory Visit: Payer: Self-pay

## 2016-05-09 ENCOUNTER — Encounter: Payer: Self-pay | Admitting: Internal Medicine

## 2016-05-09 VITALS — BP 162/92 | HR 84 | Temp 98.4°F | Resp 14 | Ht 69.5 in | Wt 175.0 lb

## 2016-05-09 DIAGNOSIS — J449 Chronic obstructive pulmonary disease, unspecified: Secondary | ICD-10-CM

## 2016-05-09 DIAGNOSIS — B370 Candidal stomatitis: Secondary | ICD-10-CM

## 2016-05-09 DIAGNOSIS — J111 Influenza due to unidentified influenza virus with other respiratory manifestations: Secondary | ICD-10-CM | POA: Diagnosis not present

## 2016-05-09 LAB — POCT WET PREP WITH KOH
CLUE CELLS WET PREP PER HPF POC: NEGATIVE
KOH Prep POC: POSITIVE — AB
RBC Wet Prep HPF POC: NEGATIVE
Trichomonas, UA: NEGATIVE
WBC WET PREP PER HPF POC: NEGATIVE
YEAST WET PREP PER HPF POC: POSITIVE

## 2016-05-09 MED ORDER — OSELTAMIVIR PHOSPHATE 75 MG PO CAPS: 75.0000 mg | ORAL_CAPSULE | Freq: Two times a day (BID) | ORAL | 0 refills | Status: DC

## 2016-05-09 MED ORDER — CLONAZEPAM 0.5 MG PO TABS: 0.5000 mg | ORAL_TABLET | Freq: Two times a day (BID) | ORAL | 0 refills | Status: DC | PRN

## 2016-05-09 MED ORDER — FLUCONAZOLE 100 MG PO TABS: ORAL_TABLET | ORAL | 0 refills | Status: DC

## 2016-05-09 NOTE — Addendum Note (Signed)
Addended by: Marcene DuosMULBERRY, Klever [place] M on: 05/09/2016 12:35 PM   Modules accepted: Orders

## 2016-05-09 NOTE — Patient Instructions (Addendum)
Decrease Citalopram to 10 mg daily while taking Fluconazole  Brush teeth and tongue after each use of Symbicort and gargle with listerine each time as well.

## 2016-05-09 NOTE — Progress Notes (Addendum)
   Subjective:    Patient ID: Glenn Allison, male    DOB: [birth year], 61 y.o.   MRN: [id number]  HPI   Yesterday afternoon, developed a cough, sweating, chills, temp of 101.  Cough productive of off white sputum  Feels short of breath more than usual.  Has had a panic attack on way over today.   Has taken Guaifenesin and Ibuprofen. Some nausea this morning, but no actual vomiting or diarrhea.   Bps at home have been 138/88.  Taking bp meds regularly per significant other.  No smoking for some time.  Again, did not get influenza vaccine this year. Has not had Pneumococcal vaccine.  Current Meds  Medication Sig  . albuterol (PROVENTIL HFA;VENTOLIN HFA) 108 (90 Base) MCG/ACT inhaler Inhale 2 puffs into the lungs every 6 (six) hours as needed for wheezing or shortness of breath.  Marland Kitchen. amLODipine (NORVASC) 5 MG tablet Take 1 tablet (5 mg total) by mouth daily.  Marland Kitchen. aspirin EC 81 MG tablet Take 1 tablet (81 mg total) by mouth daily.  . budesonide-formoterol (SYMBICORT) 160-4.5 MCG/ACT inhaler Inhale 2 puffs into the lungs 2 (two) times daily.  . citalopram (CELEXA) 20 MG tablet Take 1 tablet (20 mg total) by mouth daily.  . metoprolol succinate (TOPROL-XL) 100 MG 24 hr tablet Take 1 tablet (100 mg total) by mouth daily. Take with or immediately following a meal.   No Known Allergies        Review of Systems     Objective:   Physical Exam Mildly ill appearing HEENT:  PERRL, EOMI, minimal conjunctival injection, TMs pearly gray, throat with mild injection and adherent white plaques mainly on soft palate, swab taken. Neck:  Supple, No adenopathy Chest:  CTA CV:  RRR without murmur or rub, radial pulses normal and equal Abd:  S, NT, No HSM or mass, + BS Skin:  No rash        Assessment & Plan:  1.  Likely influenza:  Discussed again need for immunization yearly.  Will need to get Pneumococcal vaccine when better (does not like injections) Tamiflu 75 mg twice daily for 5 days.  OTC support to avoid decongestants.  2.  Oral Thrush:  Once again, recommended swish and brush of teeth and tongue after use of Symbicort  3.  Hypertension: had a panic attack on way in.  Girlfriend states his bp was fine when not ill.  As patient leaving, girlfriend insists on pulmonary referral, something I had discussed with patient about holding on until he is done with Cardiology referral if nothing found to cause his increased dyspnea. Also requested a refill of clonazepam, though we have discussed multiple times would not become a recurrent prescription. As I am decreasing Citalopram for next 14 days while on Fluconazole, will give him a small refill of clonazepam.

## 2016-05-09 NOTE — Telephone Encounter (Signed)
Spoke with patient. Appointment set for 06/01/16 @ 9:45 am with Dr. Katrinka BlazingSmith.

## 2016-05-23 ENCOUNTER — Encounter: Payer: Self-pay | Admitting: Interventional Cardiology

## 2016-05-31 DIAGNOSIS — R0609 Other forms of dyspnea: Principal | ICD-10-CM | POA: Insufficient documentation

## 2016-05-31 NOTE — Progress Notes (Signed)
Cardiology Office Note    Date:  06/01/2016   ID:  Glenn Allison, DOB Oct 21, 1955, MRN [id number]  PCP:  Glenn Allison,ELIZABETH, MD  Cardiologist: Glenn NoeHenry W Draydon Clairmont III, MD   Chief Complaint  Patient presents with  . Shortness of Breath    Possible COPD  . Chest Pain    Possible CAD    History of Present Illness:  Glenn Allison is a 61 y.o. male [facility] by Glenn Allison for evaluation of dyspnea and chest pressure.  Glenn Allison is the son of Glenn Allison, previously cared for by me for coronary disease. He is accompanied by his girlfriend. She is a Engineer, civil (consulting)nurse. They have noted a several year history, greater than 5, of exertional dyspnea. Exertional dyspnea has gotten progressively worse. He is unable to do things such as ambulating to carry out the garbage, the walking required to get into our office, or any fast walking without significant dyspnea and chest pressure. He states the occurrence of the chest pressure has increased in severity but has been present since the beginning of his episodes of dyspnea. Rest helps to relieve the discomfort and dyspnea. He can take several minutes to resolve. He was given a rescue inhaler, which also seems to help hasten improvement.  Past Medical History:  Diagnosis Date  . COPD (chronic obstructive pulmonary disease) (HCC) 06/01/2016   Greater than 50-pack-year smoking history  . Hyperlipidemia   . Hypertension     Past Surgical History:  Procedure Laterality Date  . None      Current Medications: Outpatient Medications Prior to Visit  Medication Sig Dispense Refill  . albuterol (PROVENTIL HFA;VENTOLIN HFA) 108 (90 Base) MCG/ACT inhaler Inhale 2 puffs into the lungs every 6 (six) hours as needed for wheezing or shortness of breath. 1 Inhaler 2  . amLODipine (NORVASC) 5 MG tablet Take 1 tablet (5 mg total) by mouth daily. 30 tablet 11  . aspirin EC 81 MG tablet Take 1 tablet (81 mg total) by mouth daily.    .  budesonide-formoterol (SYMBICORT) 160-4.5 MCG/ACT inhaler Inhale 2 puffs into the lungs 2 (two) times daily. 1 Inhaler 11  . citalopram (CELEXA) 20 MG tablet Take 1 tablet (20 mg total) by mouth daily. 30 tablet 11  . clonazePAM (KLONOPIN) 0.5 MG tablet Take 1 tablet (0.5 mg total) by mouth 2 (two) times daily as needed for anxiety. 10 tablet 0  . metoprolol succinate (TOPROL-XL) 100 MG 24 hr tablet Take 1 tablet (100 mg total) by mouth daily. Take with or immediately following a meal. 30 tablet 11  . nitroGLYCERIN (NITROSTAT) 0.4 MG SL tablet 1 tab sublingual for significant dyspnea, may repeat every 5 minutes x2 if not relieved 25 tablet 1  . fluconazole (DIFLUCAN) 100 MG tablet 2 tabs by mouth today, then 1 tab daily for 13 more days. 15 tablet 0  . oseltamivir (TAMIFLU) 75 MG capsule Take 1 capsule (75 mg total) by mouth 2 (two) times daily. 10 capsule 0   No facility-administered medications prior to visit.      Allergies:   Patient has no known allergies.   Social History   Social History  . Marital status: Single    Spouse name: N/A  . Number of children: 0  . Years of education: GTI   Occupational History  . unemployed     Was a Geophysical data processorconstruction manager   Social History Main Topics  . Smoking status: Former Smoker  Packs/day: 1.50    Years: 40.00    Types: Cigarettes    Start date: 12/01/1972    Quit date: 03/12/2012  . Smokeless tobacco: Never Used  . Alcohol use 0.6 oz/week    1 Standard drinks or equivalent per week     Comment: socially  . Drug use: No  . Sexual activity: Yes    Partners: Female   Other Topics Concern  . 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   Grew up in [place] and graduated from [employer].   Made living in [place].   Cared for his mother for 10 years and has ignored his own health.   Not seen by a doctor for 16 years.     Family History:  The patient's family  history includes Alcohol abuse in his father; Diabetes in his mother; Heart disease in his mother; Hyperlipidemia in his brother and mother; Hypertension in his mother.   ROS:   Please see the history of present illness.    sweats easily with physical activity. Snores when sleeping. Wheezing, anxiety, and headaches. Dizziness, depression, and dyspnea when supine. No peripheral edema.l other systems reviewed and are negative.   PHYSICAL EXAM:   VS:  BP (!) 168/100 (BP Location: Right Arm)   Pulse (!) 53   Ht 5\' 11"  (1.803 m)   Wt 175 lb 6.4 oz (79.6 kg)   BMI 24.46 kg/m    GEN: Well nourished, well developed, in no acute distress  HEENT: normal  Neck: no JVD, carotid bruits, or masses Cardiac: RRR; no murmurs, rubs, or gallops,no edema  Respiratory:  clear to auscultation bilaterally, normal work of breathing GI: soft, nontender, nondistended, + BS MS: no deformity or atrophy  Skin: warm and dry, no rash Neuro:  Alert and Oriented x 3, Strength and sensation are intact Psych: euthymic mood, full affect  Wt Readings from Last 3 Encounters:  06/01/16 175 lb 6.4 oz (79.6 kg)  05/09/16 175 lb (79.4 kg)  04/20/16 174 lb (78.9 kg)      Studies/Labs Reviewed:   EKG:  EKG Sinus bradycardia at 53 bpm. Otherwise normal.  Recent Labs: 07/05/2015: TSH 1.690 04/27/2016: ALT 18; BUN 10; Creatinine, Ser 0.97; Platelets 265; Potassium 5.1; Sodium 139   Lipid Panel    Component Value Date/Time   CHOL 231 (H) 04/27/2016 0842   TRIG 66 04/27/2016 0842   HDL 72 04/27/2016 0842   LDLCALC 146 (H) 04/27/2016 1610    Additional studies/ records that were reviewed today : Recent laboratory data with no significant abnormalities found.   CONCLUSIONS / ASSESSMENT: 1. Dyspnea on exertion   2. Angina pectoris (HCC)   3. Essential hypertension   4. Panlobular emphysema (HCC)   5. Panic disorder      PLAN:  In order of problems listed above:  1. Etiology of dyspnea is probably  multifactorial. Given the severity of his smoking history, COPD is highly likely to be contributing. Additional considerations include diastolic heart failure from poorly controlled blood pressure and possibly also superimposed CAD. 2. We will perform an exercise treadmill test to assess exertional tolerance and exclude high risk clinical scenario. If he is very limited, further evaluation for coronary disease with nuclear scintigraphy or coronary angiography may become necessary. 3. Very poorly controlled blood pressure on the current regimen with which he is compliant. Add HCTZ 12.5 mg daily.  I have encouraged limitation of salt intake and alcohol intake. 4. Pulmonary function testing with and without bronchodilator therapy is ordered to quantitate severity of COPD. A BNP is also ordered.  Clinical follow-up in 2-3 weeks after laboratory data, pulmonary function tests, an exercise treadmill test. Blood pressure will be reassessed at that time.   Medication Adjustments/Labs and Tests Ordered: Current medicines are reviewed at length with the patient today.  Concerns regarding medicines are outlined above.  Medication changes, Labs and Tests ordered today are listed in the Patient Instructions below. Patient Instructions  Medication Instructions:  1) START Hydrochlorothiazide 12.5mg  once daily  Labwork: Your physician recommends that you return for lab work in: 2-3 weeks (BMET, BNP)   Testing/Procedures: Your physician has requested that you have an exercise tolerance test. For further information please visit [email or web address] Please also follow instruction sheet, as given.  Your physician has recommended that you have a pulmonary function test. Pulmonary Function Tests are a group of tests that measure how well air moves in and out of your lungs.   Follow-Up: Your physician recommends that you schedule a follow-up appointment in: 1 month with Dr. Katrinka Blazing. (Can use slot on 07/12/16)   Any  Other Special Instructions Will Be Listed Below (If Applicable).     If you need a refill on your cardiac medications before your next appointment, please call your pharmacy.      Signed, Glenn Noe, MD  06/01/2016 12:57 PM    [facility] [address] [place], [place], [place]  [address] Phone: [phone]; Fax: [phone]

## 2016-06-01 ENCOUNTER — Ambulatory Visit (INDEPENDENT_AMBULATORY_CARE_PROVIDER_SITE_OTHER): Payer: Medicaid Other | Admitting: Interventional Cardiology

## 2016-06-01 ENCOUNTER — Encounter: Payer: Self-pay | Admitting: Interventional Cardiology

## 2016-06-01 VITALS — BP 168/100 | HR 53 | Ht 71.0 in | Wt 175.4 lb

## 2016-06-01 DIAGNOSIS — R0609 Other forms of dyspnea: Secondary | ICD-10-CM

## 2016-06-01 DIAGNOSIS — J431 Panlobular emphysema: Secondary | ICD-10-CM

## 2016-06-01 DIAGNOSIS — F410 Panic disorder [episodic paroxysmal anxiety] without agoraphobia: Secondary | ICD-10-CM | POA: Diagnosis not present

## 2016-06-01 DIAGNOSIS — I209 Angina pectoris, unspecified: Secondary | ICD-10-CM | POA: Diagnosis not present

## 2016-06-01 DIAGNOSIS — I10 Essential (primary) hypertension: Secondary | ICD-10-CM | POA: Diagnosis not present

## 2016-06-01 DIAGNOSIS — J449 Chronic obstructive pulmonary disease, unspecified: Secondary | ICD-10-CM

## 2016-06-01 HISTORY — DX: Chronic obstructive pulmonary disease, unspecified: J44.9

## 2016-06-01 MED ORDER — HYDROCHLOROTHIAZIDE 12.5 MG PO CAPS: 12.5000 mg | ORAL_CAPSULE | Freq: Every day | ORAL | 3 refills | Status: DC

## 2016-06-01 NOTE — Patient Instructions (Addendum)
Medication Instructions:  1) START Hydrochlorothiazide 12.5mg  once daily  Labwork: Your physician recommends that you return for lab work in: 2-3 weeks (BMET, BNP)   Testing/Procedures: Your physician has requested that you have an exercise tolerance test. For further information please visit [email or web address] Please also follow instruction sheet, as given.  Your physician has recommended that you have a pulmonary function test. Pulmonary Function Tests are a group of tests that measure how well air moves in and out of your lungs.   Follow-Up: Your physician recommends that you schedule a follow-up appointment in: 1 month with Dr. Katrinka BlazingSmith. (Can use slot on 07/12/16)   Any Other Special Instructions Will Be Listed Below (If Applicable).     If you need a refill on your cardiac medications before your next appointment, please call your pharmacy.

## 2016-06-04 ENCOUNTER — Telehealth: Payer: Self-pay | Admitting: Interventional Cardiology

## 2016-06-04 NOTE — Telephone Encounter (Signed)
Verbal permission obtained from pt to speak with Clydie BraunKaren.  Advised Clydie BraunKaren that we can change PFT to hospital.  She states they prefer a Wednesday if possible but can work around it if not available.  Advised I would have [facility] call her to change appt.  Clydie BraunKaren appreciative for call.  Spoke with Omar PersonSharon Ferguson and she will change pt's appt to hospital and contact them with appt.

## 2016-06-04 NOTE — Telephone Encounter (Signed)
New Message  Pt daughter call requesting to speak with RN about changing the location of the pulmonary functioning test for a sooner date. Pt daughter wants to know [facility]. Please call back to discuss

## 2016-06-14 ENCOUNTER — Telehealth: Payer: Self-pay | Admitting: Interventional Cardiology

## 2016-06-14 NOTE — Telephone Encounter (Signed)
Pt having GXT tomorrow and wanted to know which medications he should hold.  Advised pt not to take Metoprolol tomorrow morning.  Pt verbalized understanding and was appreciative for call.

## 2016-06-14 NOTE — Telephone Encounter (Signed)
New message   Pt is calling to find out which medication he can take in the morning before his test.

## 2016-06-15 ENCOUNTER — Other Ambulatory Visit: Payer: Medicaid Other | Admitting: *Deleted

## 2016-06-15 ENCOUNTER — Ambulatory Visit (INDEPENDENT_AMBULATORY_CARE_PROVIDER_SITE_OTHER): Payer: Medicaid Other

## 2016-06-15 DIAGNOSIS — J410 Simple chronic bronchitis: Secondary | ICD-10-CM

## 2016-06-15 DIAGNOSIS — I209 Angina pectoris, unspecified: Secondary | ICD-10-CM | POA: Diagnosis not present

## 2016-06-15 DIAGNOSIS — R0609 Other forms of dyspnea: Secondary | ICD-10-CM

## 2016-06-15 DIAGNOSIS — I10 Essential (primary) hypertension: Secondary | ICD-10-CM

## 2016-06-15 LAB — BASIC METABOLIC PANEL
BUN / CREAT RATIO: 17 (ref 9–20)
BUN: 16 mg/dL (ref 6–24)
CO2: 26 mmol/L (ref 18–29)
CREATININE: 0.95 mg/dL (ref 0.76–1.27)
Calcium: 9.6 mg/dL (ref 8.7–10.2)
Chloride: 97 mmol/L (ref 96–106)
GFR calc Af Amer: 101 mL/min/{1.73_m2} (ref >59)
GFR, EST NON AFRICAN AMERICAN: 87 mL/min/{1.73_m2} (ref >59)
Glucose: 84 mg/dL (ref 65–99)
POTASSIUM: 4.2 mmol/L (ref 3.5–5.2)
SODIUM: 138 mmol/L (ref 134–144)

## 2016-06-15 LAB — EXERCISE TOLERANCE TEST
CHL CUP RESTING HR STRESS: 67 {beats}/min
CHL RATE OF PERCEIVED EXERTION: 18
CSEPED: 1 min
CSEPEDS: 57 s
CSEPEW: 4.5 METS
CSEPPHR: 92 {beats}/min
MPHR: 161 {beats}/min
Percent HR: 57 %

## 2016-06-15 LAB — PRO B NATRIURETIC PEPTIDE: NT-Pro BNP: 33 pg/mL (ref 0–210)

## 2016-06-15 NOTE — Addendum Note (Signed)
Addended by: Tonita Phoenix on: 06/15/2016 10:38 AM   Modules accepted: Orders

## 2016-06-18 ENCOUNTER — Encounter (HOSPITAL_COMMUNITY): Payer: Self-pay

## 2016-06-18 ENCOUNTER — Ambulatory Visit: Payer: Medicaid Other | Admitting: Internal Medicine

## 2016-06-20 ENCOUNTER — Ambulatory Visit (HOSPITAL_COMMUNITY)
Admission: RE | Admit: 2016-06-20 | Discharge: 2016-06-20 | Disposition: A | Discharge status: Home / Self Care | Payer: Medicaid Other | Source: Ambulatory Visit | Attending: Interventional Cardiology | Admitting: Interventional Cardiology

## 2016-06-20 DIAGNOSIS — J449 Chronic obstructive pulmonary disease, unspecified: Secondary | ICD-10-CM | POA: Diagnosis not present

## 2016-06-20 DIAGNOSIS — J431 Panlobular emphysema: Secondary | ICD-10-CM

## 2016-06-20 DIAGNOSIS — R0609 Other forms of dyspnea: Secondary | ICD-10-CM | POA: Diagnosis present

## 2016-06-20 LAB — PULMONARY FUNCTION TEST
DL/VA % pred: 57 %
DL/VA: 2.71 ml/min/mmHg/L
DLCO UNC: 13.12 ml/min/mmHg
DLCO unc % pred: 37 %
FEF 25-75 PRE: 0.17 L/s
FEF 25-75 Post: 0.23 L/sec
FEF2575-%Change-Post: 33 %
FEF2575-%PRED-POST: 7 %
FEF2575-%Pred-Pre: 5 %
FEV1-%CHANGE-POST: 12 %
FEV1-%PRED-POST: 20 %
FEV1-%PRED-PRE: 18 %
FEV1-POST: 0.7 L
FEV1-Pre: 0.62 L
FEV1FVC-%Change-Post: -4 %
FEV1FVC-%Pred-Pre: 35 %
FEV6-%Change-Post: 13 %
FEV6-%PRED-POST: 41 %
FEV6-%PRED-PRE: 36 %
FEV6-POST: 1.73 L
FEV6-PRE: 1.52 L
FEV6FVC-%CHANGE-POST: -3 %
FEV6FVC-%PRED-PRE: 70 %
FEV6FVC-%Pred-Post: 68 %
FVC-%CHANGE-POST: 17 %
FVC-%PRED-POST: 60 %
FVC-%Pred-Pre: 51 %
FVC-Post: 2.62 L
FVC-Pre: 2.22 L
POST FEV1/FVC RATIO: 27 %
POST FEV6/FVC RATIO: 66 %
PRE FEV6/FVC RATIO: 68 %
Pre FEV1/FVC ratio: 28 %
RV % PRED: 243 %
RV: 5.7 L
TLC % pred: 109 %
TLC: 8.07 L

## 2016-06-20 MED ORDER — ALBUTEROL SULFATE (2.5 MG/3ML) 0.083% IN NEBU
2.5000 mg | INHALATION_SOLUTION | Freq: Once | RESPIRATORY_TRACT | Status: AC
Start: 2016-06-20 — End: 2016-06-20
  Administered 2016-06-20: 2.5 mg via RESPIRATORY_TRACT

## 2016-06-21 ENCOUNTER — Telehealth: Payer: Self-pay | Admitting: Interventional Cardiology

## 2016-06-21 DIAGNOSIS — J449 Chronic obstructive pulmonary disease, unspecified: Secondary | ICD-10-CM

## 2016-06-21 DIAGNOSIS — R0602 Shortness of breath: Secondary | ICD-10-CM

## 2016-06-21 NOTE — Telephone Encounter (Addendum)
Pt was given the lab results and Dr Katrinka Blazing recommendation.   Pt states that his BP continue to be high. He states that when he came for the treadmill, his BP was 170/100 when he was walking in the treadmill his systolic BP went up to 250. Pt is taking HCTZ 12.5 mg daily starting last month 06/01/16. Pt states that he walks and after 50 yards of walking he has to stop because he is SOB. Pt states that now he knows is his Lungs. He would like to know if Dr. Katrinka Blazing can refer him to   Pulmonary medicine, because he does not rely on her PCP to do that. Pt is planning to change PCP.  Pt was not able to take today's BP until later today. 1:30 PM pt and friend called BP reading. BP was down to 118/ 74 on left arm after sitting for a while. Pt states this is the first time he has a low BP reading for a while. Pt and friend are concern about pt's P/B got up so high to 250 systolic while doing the Treadmill. Pt would like to know what Dr Katrinka Blazing  is  plannnig next on pt. Pt would like for Dr Katrinka Blazing to call and talk to them about what it can be done next after all the tests and the PFT results.Marland Kitchen

## 2016-06-21 NOTE — Telephone Encounter (Signed)
New message    Pt returning a phone call

## 2016-06-22 NOTE — Telephone Encounter (Signed)
Spoke with pt and went over lab, ETT and PFT results.  Pt would like to be referred to pulmonology as he prefers his PCP not follow these issues.  Advised I would place a referral to [facility] and their office would contact him to schedule his appt.  Pt verbalized understanding and was appreciative for call.

## 2016-06-26 ENCOUNTER — Ambulatory Visit (INDEPENDENT_AMBULATORY_CARE_PROVIDER_SITE_OTHER)
Admission: RE | Admit: 2016-06-26 | Discharge: 2016-06-26 | Disposition: A | Discharge status: Home / Self Care | Payer: Medicaid Other | Source: Ambulatory Visit | Attending: Pulmonary Disease | Admitting: Pulmonary Disease

## 2016-06-26 ENCOUNTER — Other Ambulatory Visit: Payer: Medicaid Other

## 2016-06-26 ENCOUNTER — Encounter: Payer: Self-pay | Admitting: Pulmonary Disease

## 2016-06-26 ENCOUNTER — Ambulatory Visit (INDEPENDENT_AMBULATORY_CARE_PROVIDER_SITE_OTHER): Payer: Medicaid Other | Admitting: Pulmonary Disease

## 2016-06-26 VITALS — BP 132/78 | HR 70 | Ht 71.0 in | Wt 178.0 lb

## 2016-06-26 DIAGNOSIS — Z87891 Personal history of nicotine dependence: Secondary | ICD-10-CM

## 2016-06-26 DIAGNOSIS — J410 Simple chronic bronchitis: Secondary | ICD-10-CM

## 2016-06-26 DIAGNOSIS — R0609 Other forms of dyspnea: Principal | ICD-10-CM

## 2016-06-26 DIAGNOSIS — F411 Generalized anxiety disorder: Secondary | ICD-10-CM

## 2016-06-26 DIAGNOSIS — F17210 Nicotine dependence, cigarettes, uncomplicated: Secondary | ICD-10-CM

## 2016-06-26 DIAGNOSIS — F410 Panic disorder [episodic paroxysmal anxiety] without agoraphobia: Secondary | ICD-10-CM

## 2016-06-26 MED ORDER — FLUTICASONE-UMECLIDIN-VILANT 100-62.5-25 MCG/INH IN AEPB: 1.0000 | INHALATION_SPRAY | Freq: Every day | RESPIRATORY_TRACT | 0 refills | Status: DC

## 2016-06-26 NOTE — Assessment & Plan Note (Signed)
He smoked a pack of cigarettes daily for 38 years and quit at age 61. He is at increased risk for lung cancer. He is interested in paying out of pocket for a lung cancer screening CT in lieu of Medicare coverage. We will refer him to the lung cancer screening program.

## 2016-06-26 NOTE — Progress Notes (Signed)
Subjective:    Patient ID: Glenn Allison, male    DOB: 15-Feb-1956, 61 y.o.   MRN: [id number]  HPI Chief Complaint  Patient presents with  . Advice Only    Referred by Dr. Katrinka Blazing for COPD, worsening SOB.      Korin is here to see me due to severe dyspnea: > sometimes associated with chest tightness > has been going on for years > he had not really been paying attention to himself because he was caring for his mother > he notices it frequently > worse with walking any more than 50 feet > carrying in groceries, garbage makes it worse > associated with wheezing > anxiety makes the dyspnea worse > albuterol will sometimes help > resting makes it go away > dyspnea will last 5-10 minutes > he feels light headed when he is dyspneic, and feels that he can't get air in  He doesn't cough much.  He used to smoke 1 ppd.  He smoked from age 29 until age 88.  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  There was a lot of smoke from the machine that used to spread the waterproofing.   He snores frequently when he sleeps and his wife noted some labored breathing.  He has been taking symbicort and a rescue inhaler for a few years. He takes the symbicort twice a day which helps.    No episodes of bronchitis or pneumonia in several years. He had more chest congestion when he was smoking.    Past Medical History:  Diagnosis Date  . COPD (chronic obstructive pulmonary disease) (HCC) 06/01/2016   Greater than 50-pack-year smoking history  . Hyperlipidemia   . Hypertension      Family History  Problem Relation Age of Onset  . Heart disease Mother     2 stents  . Diabetes Mother   . Hyperlipidemia Mother   . Hypertension Mother   . Alcohol abuse Father   . Hyperlipidemia Brother      Social History   Social History  . Marital status: Single    Spouse name: N/A    . Number of children: 0  . Years of education: GTI   Occupational History  . unemployed     Was a Geophysical data processor   Social History Main Topics  . Smoking status: Former Smoker    Packs/day: 1.50    Years: 40.00    Types: Cigarettes    Start date: 12/01/1972    Quit date: 03/12/2012  . Smokeless tobacco: Never Used  . Alcohol use 0.6 oz/week    1 Standard drinks or equivalent per week     Comment: socially  . Drug use: No  . Sexual activity: Yes    Partners: Female   Other Topics Concern  . 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   Grew up in [place] and graduated from [employer].   Made living in [employer].   Cared for his mother for 10 years and has ignored his own health.   Not seen by a doctor for 16 years.     No Known Allergies   Outpatient Medications Prior to Visit  Medication Sig Dispense Refill  . albuterol (PROVENTIL HFA;VENTOLIN HFA) 108 (  90 Base) MCG/ACT inhaler Inhale 2 puffs into the lungs every 6 (six) hours as needed for wheezing or shortness of breath. 1 Inhaler 2  . amLODipine (NORVASC) 5 MG tablet Take 1 tablet (5 mg total) by mouth daily. 30 tablet 11  . aspirin EC 81 MG tablet Take 1 tablet (81 mg total) by mouth daily.    . budesonide-formoterol (SYMBICORT) 160-4.5 MCG/ACT inhaler Inhale 2 puffs into the lungs 2 (two) times daily. 1 Inhaler 11  . citalopram (CELEXA) 20 MG tablet Take 1 tablet (20 mg total) by mouth daily. 30 tablet 11  . clonazePAM (KLONOPIN) 0.5 MG tablet Take 1 tablet (0.5 mg total) by mouth 2 (two) times daily as needed for anxiety. 10 tablet 0  . hydrochlorothiazide (MICROZIDE) 12.5 MG capsule Take 1 capsule (12.5 mg total) by mouth daily. 90 capsule 3  . metoprolol succinate (TOPROL-XL) 100 MG 24 hr tablet Take 1 tablet (100 mg total) by mouth daily. Take with or immediately following a meal. 30 tablet 11  . nitroGLYCERIN (NITROSTAT) 0.4 MG  SL tablet 1 tab sublingual for significant dyspnea, may repeat every 5 minutes x2 if not relieved 25 tablet 1   No facility-administered medications prior to visit.       Review of Systems  Constitutional: Negative for fever and unexpected weight change.  HENT: Negative for congestion, dental problem, ear pain, nosebleeds, postnasal drip, rhinorrhea, sinus pressure, sneezing, sore throat and trouble swallowing.   Eyes: Negative for redness and itching.  Respiratory: Positive for chest tightness, shortness of breath and wheezing. Negative for cough.   Cardiovascular: Negative for palpitations and leg swelling.  Gastrointestinal: Negative for nausea and vomiting.  Genitourinary: Negative for dysuria.  Musculoskeletal: Negative for joint swelling.  Skin: Negative for rash.  Neurological: Positive for headaches.  Hematological: Does not bruise/bleed easily.  Psychiatric/Behavioral: Negative for dysphoric mood. The patient is not nervous/anxious.        Objective:   Physical Exam  Vitals:   06/26/16 1501  BP: 132/78  Pulse: 70  SpO2: 98%  Weight: 178 lb (80.7 kg)  Height:  (1.803 m)   RA  Gen: well appearing, no acute distress HENT: NCAT, OP clear, neck supple without masses Eyes: PERRL, EOMi Lymph: no cervical lymphadenopathy PULM: CTA B CV: RRR, no mgr, no JVD GI: BS+, soft, nontender, no hsm Derm: no rash or skin breakdown MSK: normal bulk and tone Neuro: A&Ox4, CN II-XII intact, strength 5/5 in all 4 extremities Psyche: normal mood and affect  BMET    Component Value Date/Time   NA 138 06/15/2016 1039   K 4.2 06/15/2016 1039   CL 97 06/15/2016 1039   CO2 26 06/15/2016 1039   GLUCOSE 84 06/15/2016 1039   BUN 16 06/15/2016 1039   CREATININE 0.95 06/15/2016 1039   CALCIUM 9.6 06/15/2016 1039   GFRNONAA 87 06/15/2016 1039   GFRAA 101 06/15/2016 1039     Records from his visit with Dr. Katrinka Blazing reviewed were he was started on HCTZ for hypertension and a  stress test was ordered. He was referred to me for evaluation of shortness of breath.     Assessment & Plan:  Cigarette smoker He smoked a pack of cigarettes daily for 38 years and quit at age 56. He is at increased risk for lung cancer. He is interested in paying out of pocket for a lung cancer screening CT in lieu of Medicare coverage. We will refer him to the lung cancer screening program.  COPD (chronic obstructive pulmonary disease) (HCC) He has very severe COPD based on his pulmonary function test from a week ago which showed an FEV1 of 20% predicted.  This clearly explains his dyspnea. This is due to prior tobacco use.  He is deconditioned and needs to exercise more.  Plan: Stop Symbicort Give sample of Trelegy Exercise more at home, will make referral to pulmonary rehabilitation if he is not doing well with this Ambulatory oximetry monitoring today in clinic Chest x-ray today  Panic disorder Contributing to his dyspnea.  Will refer to behavioral health.    Current Outpatient Prescriptions:  .  albuterol (PROVENTIL HFA;VENTOLIN HFA) 108 (90 Base) MCG/ACT inhaler, Inhale 2 puffs into the lungs every 6 (six) hours as needed for wheezing or shortness of breath., Disp: 1 Inhaler, Rfl: 2 .  amLODipine (NORVASC) 5 MG tablet, Take 1 tablet (5 mg total) by mouth daily., Disp: 30 tablet, Rfl: 11 .  aspirin EC 81 MG tablet, Take 1 tablet (81 mg total) by mouth daily., Disp: , Rfl:  .  budesonide-formoterol (SYMBICORT) 160-4.5 MCG/ACT inhaler, Inhale 2 puffs into the lungs 2 (two) times daily., Disp: 1 Inhaler, Rfl: 11 .  citalopram (CELEXA) 20 MG tablet, Take 1 tablet (20 mg total) by mouth daily., Disp: 30 tablet, Rfl: 11 .  clonazePAM (KLONOPIN) 0.5 MG tablet, Take 1 tablet (0.5 mg total) by mouth 2 (two) times daily as needed for anxiety., Disp: 10 tablet, Rfl: 0 .  hydrochlorothiazide (MICROZIDE) 12.5 MG capsule, Take 1 capsule (12.5 mg total) by mouth daily., Disp: 90 capsule, Rfl:  3 .  metoprolol succinate (TOPROL-XL) 100 MG 24 hr tablet, Take 1 tablet (100 mg total) by mouth daily. Take with or immediately following a meal., Disp: 30 tablet, Rfl: 11 .  nitroGLYCERIN (NITROSTAT) 0.4 MG SL tablet, 1 tab sublingual for significant dyspnea, may repeat every 5 minutes x2 if not relieved, Disp: 25 tablet, Rfl: 1 .  Fluticasone-Umeclidin-Vilant (TRELEGY ELLIPTA) 100-62.5-25 MCG/INH AEPB, Inhale 1 puff into the lungs daily., Disp: 1 each, Rfl: 0

## 2016-06-26 NOTE — Patient Instructions (Addendum)
Take Trelegy one puff daily no matter how you feel Take albuterol as needed for shortness of breath Exercise regularly, start walking at home We will call you with the results of the CXR We will refer you to behavioral health for evaluation and management of your anxiety We will refer you to the low-dose lung cancer screening program. WE will see you back in 3-4 weeks with an NP to see how you are doing on the new medicine

## 2016-06-26 NOTE — Assessment & Plan Note (Signed)
He has very severe COPD based on his pulmonary function test from a week ago which showed an FEV1 of 20% predicted.  This clearly explains his dyspnea. This is due to prior tobacco use.  He is deconditioned and needs to exercise more.  Plan: Stop Symbicort Give sample of Trelegy Exercise more at home, will make referral to pulmonary rehabilitation if he is not doing well with this Ambulatory oximetry monitoring today in clinic Chest x-ray today

## 2016-06-26 NOTE — Assessment & Plan Note (Signed)
Contributing to his dyspnea.  Will refer to behavioral health.

## 2016-06-28 ENCOUNTER — Telehealth: Payer: Self-pay | Admitting: Pulmonary Disease

## 2016-06-28 NOTE — Telephone Encounter (Signed)
Spoke with pt and notified of results per Dr.McQuaid. Pt verbalized understanding and denied any questions. 

## 2016-06-28 NOTE — Progress Notes (Signed)
Spoke with pt and notified of results per Dr.McQuaid. Pt verbalized understanding and denied any questions. 

## 2016-06-28 NOTE — Telephone Encounter (Signed)
Lupita Leash, MD sent to Velvet Bathe, CMA        A,  Please let the patient know this was OK  Thanks,  B

## 2016-06-30 LAB — ALPHA-1 ANTITRYPSIN PHENOTYPE: A-1 Antitrypsin: 168 mg/dL (ref 83–199)

## 2016-07-02 ENCOUNTER — Other Ambulatory Visit: Payer: Self-pay | Admitting: Pulmonary Disease

## 2016-07-02 ENCOUNTER — Other Ambulatory Visit: Payer: Self-pay | Admitting: Internal Medicine

## 2016-07-02 MED ORDER — FLUTICASONE-UMECLIDIN-VILANT 100-62.5-25 MCG/INH IN AEPB: 1.0000 | INHALATION_SPRAY | Freq: Every day | RESPIRATORY_TRACT | 5 refills | Status: DC

## 2016-07-05 ENCOUNTER — Other Ambulatory Visit: Payer: Self-pay | Admitting: Acute Care

## 2016-07-05 ENCOUNTER — Telehealth: Payer: Self-pay | Admitting: Pulmonary Disease

## 2016-07-05 DIAGNOSIS — Z87891 Personal history of nicotine dependence: Secondary | ICD-10-CM

## 2016-07-05 DIAGNOSIS — J449 Chronic obstructive pulmonary disease, unspecified: Secondary | ICD-10-CM

## 2016-07-05 MED ORDER — ALBUTEROL SULFATE HFA 108 (90 BASE) MCG/ACT IN AERS: 2.0000 | INHALATION_SPRAY | Freq: Four times a day (QID) | RESPIRATORY_TRACT | 5 refills | Status: DC | PRN

## 2016-07-05 MED ORDER — FLUTICASONE-UMECLIDIN-VILANT 100-62.5-25 MCG/INH IN AEPB: 1.0000 | INHALATION_SPRAY | Freq: Every day | RESPIRATORY_TRACT | 5 refills | Status: DC

## 2016-07-05 NOTE — Telephone Encounter (Signed)
Pt requesting trelegy and albuterol inhaler.  This has been sent to preferred pharmacy.  Nothing further needed.

## 2016-07-06 ENCOUNTER — Telehealth: Payer: Self-pay

## 2016-07-06 NOTE — Telephone Encounter (Signed)
PA attempted through [facility] but the CSR advised me pt had to try [facility] or Advair before they pay for Trelegy. Please advise if you would like to change medication.

## 2016-07-09 ENCOUNTER — Ambulatory Visit: Payer: Self-pay

## 2016-07-09 ENCOUNTER — Telehealth: Payer: Self-pay | Admitting: Pulmonary Disease

## 2016-07-09 ENCOUNTER — Encounter: Payer: Self-pay | Admitting: Acute Care

## 2016-07-09 NOTE — Telephone Encounter (Signed)
Aware that we do not have Trelegy samples at this time. Pt advised to check back later this week.  Pt states that he is going to use his Symbicort that he has on hand-which is what he was on prior to the Trelegy- until he can either get his medication approved or get samples.   Will hold in triage to follow up in the morning on the PA with [facility].

## 2016-07-10 NOTE — Telephone Encounter (Signed)
Called [facility] at [phone] to initiate PA for Trelegy- PA is currently pending for pharmacy review- a determination is expected within 24 hours.   Reference #: [id number]   Will hold for follow-up.

## 2016-07-11 DIAGNOSIS — R0789 Other chest pain: Secondary | ICD-10-CM | POA: Insufficient documentation

## 2016-07-11 NOTE — Progress Notes (Signed)
Cardiology Office Note    Date:  07/12/2016   ID:  Glenn Allison, DOB 1956-01-03, MRN [id number]  PCP:  Mack Hook, MD  Cardiologist: Sinclair Grooms, MD   Chief Complaint  Patient presents with  . Coronary Artery Disease  . Shortness of Breath    History of Present Illness:  Glenn Allison is a 61 y.o. male referred from the [facility] by Dr. Jacqualine Code for evaluation of dyspnea and chest pressure. He evaluation to this point has included pulmonary function testing which demonstrated severe COPD.  Evaluation has included pulmonary function testing which demonstrated severe obstructive pulmonary disease with less than 50% lung capacity. He has seen Dr. Curt Jews and is on bronchodilator therapy.  Stress testing was performed but very brief duration of exercise and decreases the sensitivity of the test to identify coronary disease. His symptoms associated at coronary disease is highly suspected. He gets tightness in the chest with mild to moderate activity. The discomfort radiates into the left shoulder. Nitroglycerin has relieved the discomfort in the past. The tightness and exertional dyspnea seemed to be dissociated. Though he had no EKG changes on stress testing he did develop a tightness in his chest.  Past Medical History:  Diagnosis Date  . COPD (chronic obstructive pulmonary disease) ([place]) 06/01/2016   Greater than 50-pack-year smoking history  . Hyperlipidemia   . Hypertension     Past Surgical History:  Procedure Laterality Date  . None      Current Medications: Outpatient Medications Prior to Visit  Medication Sig Dispense Refill  . albuterol (PROVENTIL HFA;VENTOLIN HFA) 108 (90 Base) MCG/ACT inhaler Inhale 2 puffs into the lungs every 6 (six) hours as needed for wheezing or shortness of breath. 1 Inhaler 5  . amLODipine (NORVASC) 5 MG tablet Take 1 tablet (5 mg total) by mouth daily. 30 tablet 11  . aspirin EC 81 MG tablet Take 1 tablet (81 mg  total) by mouth daily.    . budesonide-formoterol (SYMBICORT) 160-4.5 MCG/ACT inhaler Inhale 2 puffs into the lungs 2 (two) times daily. 1 Inhaler 11  . citalopram (CELEXA) 20 MG tablet Take 1 tablet (20 mg total) by mouth daily. 30 tablet 11  . clonazePAM (KLONOPIN) 0.5 MG tablet Take 1 tablet (0.5 mg total) by mouth 2 (two) times daily as needed for anxiety. 10 tablet 0  . Fluticasone-Umeclidin-Vilant (TRELEGY ELLIPTA) 100-62.5-25 MCG/INH AEPB Inhale 1 puff into the lungs daily. 60 each 5  . hydrochlorothiazide (MICROZIDE) 12.5 MG capsule Take 1 capsule (12.5 mg total) by mouth daily. 90 capsule 3  . metoprolol succinate (TOPROL-XL) 100 MG 24 hr tablet Take 1 tablet (100 mg total) by mouth daily. Take with or immediately following a meal. 30 tablet 11  . nitroGLYCERIN (NITROSTAT) 0.4 MG SL tablet 1 tab sublingual for significant dyspnea, may repeat every 5 minutes x2 if not relieved 25 tablet 1   No facility-administered medications prior to visit.      Allergies:   Patient has no known allergies.   Social History   Social History  . Marital status: Single    Spouse name: N/A  . Number of children: 0  . Years of education: GTI   Occupational History  . unemployed     Was a Adult nurse   Social History Main Topics  . Smoking status: Former Smoker    Packs/day: 1.50    Years: 40.00    Types: Cigarettes    Start date: 12/01/1972  Quit date: 03/12/2012  . Smokeless tobacco: Never Used  . Alcohol use 0.6 oz/week    1 Standard drinks or equivalent per week     Comment: socially  . Drug use: No  . Sexual activity: Yes    Partners: Female   Other Topics Concern  . 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   Grew up in [place] and graduated from [employer].   Made living in [place].   Cared for his mother for 10 years and has ignored his own health.   Not seen by a doctor for 16 years.      Family History:  The patient's family history includes Alcohol abuse in his father; Diabetes in his mother; Heart disease in his mother; Hyperlipidemia in his brother and mother; Hypertension in his mother.   ROS:   Please see the history of present illness.    Orthopnea, dizziness, anxiety, snoring, wheezing, and excessive fatigue.  All other systems reviewed and are negative.   PHYSICAL EXAM:   VS:  BP (!) 164/96 (BP Location: Left Arm)   Pulse 66   Ht 6' (1.829 m)   Wt 178 lb 3.2 oz (80.8 kg)   BMI 24.17 kg/m    GEN: Well nourished, well developed, in no acute distress  HEENT: normal  Neck: no JVD, carotid bruits, or masses Cardiac: RRR; no murmurs, rubs, or gallops,no edema  Respiratory: Breath sounds are diminished. GI: soft, nontender, nondistended, + BS MS: no deformity or atrophy  Skin: warm and dry, no rash Neuro:  Alert and Oriented x 3, Strength and sensation are intact Psych: euthymic mood, full affect  Wt Readings from Last 3 Encounters:  07/12/16 178 lb 3.2 oz (80.8 kg)  06/26/16 178 lb (80.7 kg)  06/01/16 175 lb 6.4 oz (79.6 kg)      Studies/Labs Reviewed:   EKG:  EKG  Not repeated. Sinus bradycardia without significant abnormality noted on most recent EKG of February 2018.  Recent Labs: 04/27/2016: ALT 18; Platelets 265 06/15/2016: BUN 16; Creatinine, Ser 0.95; NT-Pro BNP 33; Potassium 4.2; Sodium 138   Lipid Panel    Component Value Date/Time   CHOL 231 (H) 04/27/2016 0842   TRIG 66 04/27/2016 0842   HDL 72 04/27/2016 0842   LDLCALC 146 (H) 04/27/2016 0842    Additional studies/ records that were reviewed today include:   Pulmonary function testing, 06/20/2016:  Pulmonary Function Diagnosis: Severe Obstructive Airways Disease Response to bronchodilator Severe Diffusion Defect Airtrapping and probable over-inflation   STRESS TEST 06/15/16:  Study Highlights     Blood pressure demonstrated a hypertensive response to exercise.  There  was no ST segment deviation noted during stress.   Non diagnostic ETT Patient only achieved 57% of PMHR HTN response to exercise       ASSESSMENT:    1. Angina pectoris ([place])   2. Essential hypertension   3. Simple chronic bronchitis (HCC)      PLAN:  In order of problems listed above:  1. The patient's stress test was nondiagnostic due to inability to achieve target heart rate. I have recommended coronary angiography. The patient was counseled to undergo left heart catheterization, coronary angiography, and possible percutaneous coronary intervention with stent implantation. The procedural risks and benefits were discussed in detail. The risks discussed included death, stroke, myocardial infarction, life-threatening bleeding, limb ischemia, kidney injury, allergy, and possible  emergency cardiac surgery. The risk of these significant complications were estimated to occur less than 1% of the time. After discussion, the patient has agreed to proceed.  2.  Add Diovan/valsartan 80 mg daily. Be met in one week. He eventually consolidate to valsartan HCTZ. Target blood pressure 140/90 or less.  3.  Severe COPD by PFTs. Pulmonary rehabilitation is been recommended by Dr. Curt Jews. Before I can feel comfortable with freely clearing him to exercise, we need to exclude the possibility of severe underlying CAD   Medication Adjustments/Labs and Tests Ordered: Current medicines are reviewed at length with the patient today.  Concerns regarding medicines are outlined above.  Medication changes, Labs and Tests ordered today are listed in the Patient Instructions below. There are no Patient Instructions on file for this visit.   Signed, Sinclair Grooms, MD  07/12/2016 9:21 AM    [facility] [address], [place], [place]  [address] Phone: [phone]; Fax: [phone]

## 2016-07-12 ENCOUNTER — Encounter: Payer: Self-pay | Admitting: *Deleted

## 2016-07-12 ENCOUNTER — Encounter: Payer: Self-pay | Admitting: Interventional Cardiology

## 2016-07-12 ENCOUNTER — Ambulatory Visit (INDEPENDENT_AMBULATORY_CARE_PROVIDER_SITE_OTHER): Payer: Medicaid Other | Admitting: Interventional Cardiology

## 2016-07-12 VITALS — BP 164/96 | HR 66 | Ht 72.0 in | Wt 178.2 lb

## 2016-07-12 DIAGNOSIS — I209 Angina pectoris, unspecified: Secondary | ICD-10-CM

## 2016-07-12 DIAGNOSIS — Z01812 Encounter for preprocedural laboratory examination: Secondary | ICD-10-CM | POA: Diagnosis not present

## 2016-07-12 DIAGNOSIS — J410 Simple chronic bronchitis: Secondary | ICD-10-CM

## 2016-07-12 DIAGNOSIS — I10 Essential (primary) hypertension: Secondary | ICD-10-CM

## 2016-07-12 MED ORDER — VALSARTAN 80 MG PO TABS: 80.0000 mg | ORAL_TABLET | Freq: Every day | ORAL | 3 refills | Status: DC

## 2016-07-12 NOTE — Patient Instructions (Signed)
Medication Instructions:  1) START Valsartan (Diovan) 80mg  once daily  Labwork: Your physician recommends that you return for lab work in: 1 week (CBC, BMET, INR)   Testing/Procedures: Your physician has requested that you have a cardiac catheterization. Cardiac catheterization is used to diagnose and/or treat various heart conditions. Doctors may recommend this procedure for a number of different reasons. The most common reason is to evaluate chest pain. Chest pain can be a symptom of coronary artery disease (CAD), and cardiac catheterization can show whether plaque is narrowing or blocking your heart's arteries. This procedure is also used to evaluate the valves, as well as measure the blood flow and oxygen levels in different parts of your heart. For further information please visit [email or web address]. Please follow instruction sheet, as given.   Follow-Up: Your physician recommends that you schedule a follow-up appointment in: 1 month with Dr. Katrinka BlazingSmith.    Any Other Special Instructions Will Be Listed Below (If Applicable).     If you need a refill on your cardiac medications before your next appointment, please call your pharmacy.

## 2016-07-13 MED ORDER — DIOVAN 80 MG PO TABS: 80.0000 mg | ORAL_TABLET | Freq: Every day | ORAL | 3 refills | Status: DC

## 2016-07-13 NOTE — Addendum Note (Signed)
Addended by: Julio SicksBOWERS, JENNIFER L on: 07/13/2016 07:58 AM   Modules accepted: Orders

## 2016-07-16 ENCOUNTER — Ambulatory Visit (INDEPENDENT_AMBULATORY_CARE_PROVIDER_SITE_OTHER): Payer: Medicaid Other | Admitting: Internal Medicine

## 2016-07-16 ENCOUNTER — Encounter: Payer: Self-pay | Admitting: Internal Medicine

## 2016-07-16 ENCOUNTER — Telehealth: Payer: Self-pay

## 2016-07-16 ENCOUNTER — Telehealth: Payer: Self-pay | Admitting: Pulmonary Disease

## 2016-07-16 VITALS — BP 130/80 | HR 72 | Resp 12 | Ht 70.0 in | Wt 175.0 lb

## 2016-07-16 DIAGNOSIS — R0609 Other forms of dyspnea: Secondary | ICD-10-CM

## 2016-07-16 DIAGNOSIS — J432 Centrilobular emphysema: Secondary | ICD-10-CM

## 2016-07-16 MED ORDER — FLUTICASONE-SALMETEROL 250-50 MCG/DOSE IN AEPB: 1.0000 | INHALATION_SPRAY | Freq: Two times a day (BID) | RESPIRATORY_TRACT | 5 refills | Status: DC

## 2016-07-16 MED ORDER — UMECLIDINIUM BROMIDE 62.5 MCG/INH IN AEPB: 1.0000 | INHALATION_SPRAY | Freq: Every day | RESPIRATORY_TRACT | 5 refills | Status: DC

## 2016-07-16 MED ORDER — CLONAZEPAM 0.5 MG PO TABS: 0.5000 mg | ORAL_TABLET | Freq: Two times a day (BID) | ORAL | 0 refills | Status: DC | PRN

## 2016-07-16 NOTE — Telephone Encounter (Signed)
Spoke with pharmacy who stated they were confused as to if pt is suppose to have a 30 day supply stated when he contacted the pt he described the samples where he only had 14 in it and he was unsure if we were keeping him like that. Informed Raven no that those were samples given to the pt that we want him to have a 30 supply. She understood and had no further questions at this time. Nothing further is needed

## 2016-07-16 NOTE — Telephone Encounter (Signed)
Called spoke with patient and discussed the PA, change in meds, etc Pt okay with BQ's recommendations to try Incruse daily and Adviar 250/50 twice daily Did discuss with patient to brush/rinse/gargle after every use and to please call the office to let us know how he's doing on the new medication - pt voiced his understanding Did offer to give patient sample of each (if we have them in stock) and provide demonstration in office, but pt declined this service stating he will ask his pharmacist for this Pt does have an appt with SG on 5.9.18 and will keep this appt  Rx sent to verified pharmacy Nothing further needed; will sign off

## 2016-07-16 NOTE — Telephone Encounter (Signed)
**Note De-Identified Vernetta Dizdarevic Obfuscation** Received a fax from [facility] stating that the pt will need a PA on his Valsartan. I called [facility] and did PA over the phone with Annabella. Davonna Bellingnna bella states that Diovan (Brand name) is covered under the pts plan.  I called [facility] who ran RX under brand name and it is covered.

## 2016-07-16 NOTE — Telephone Encounter (Signed)
Please let them know this.  I would advise Incruise daily + Advair 250/50 bid

## 2016-07-16 NOTE — Progress Notes (Signed)
   Subjective:    Patient ID: Glenn Allison, male    DOB: [birth year], 61 y.o.   MRN: [id number]  HPI   1.  Anxiety:  Has a cardiac cath planned for 07/25/15 with Dr. Katrinka BlazingSmith.  Needs Clonazepam to calm before the procedure.  Had inadequate length of time with treadmill stress and with hypertensive response, so going to catheterization.  2.  COPD: Improved with use of Trelegy, but not covered and will need to go through other treatments first.  Assuming will have Spiriva handihaler added to one of the corticosteroid/LABA combinations he has been using.  Getting a sample of Trelegy until gets the other medications at the pharmacy. Reportedly, plan for pulmonary rehab program.  Current Meds  Medication Sig  . albuterol (PROVENTIL HFA;VENTOLIN HFA) 108 (90 Base) MCG/ACT inhaler Inhale 2 puffs into the lungs every 6 (six) hours as needed for wheezing or shortness of breath.  Marland Kitchen. amLODipine (NORVASC) 5 MG tablet Take 1 tablet (5 mg total) by mouth daily.  Marland Kitchen. aspirin EC 81 MG tablet Take 1 tablet (81 mg total) by mouth daily.  . budesonide-formoterol (SYMBICORT) 160-4.5 MCG/ACT inhaler Inhale 2 puffs into the lungs 2 (two) times daily.  . citalopram (CELEXA) 20 MG tablet Take 1 tablet (20 mg total) by mouth daily.  . Fluticasone-Salmeterol (ADVAIR DISKUS) 250-50 MCG/DOSE AEPB Inhale 1 puff into the lungs 2 (two) times daily.  . hydrochlorothiazide (MICROZIDE) 12.5 MG capsule Take 1 capsule (12.5 mg total) by mouth daily.  . metoprolol succinate (TOPROL-XL) 100 MG 24 hr tablet Take 1 tablet (100 mg total) by mouth daily. Take with or immediately following a meal.  . nitroGLYCERIN (NITROSTAT) 0.4 MG SL tablet 1 tab sublingual for significant dyspnea, may repeat every 5 minutes x2 if not relieved    No Known Allergies   Review of Systems     Objective:   Physical Exam Lungs:  Decreased BS throughout, but clear CV:  RRR with normal S1 and S2, No S3, S4 or murmur.  Radial pulses normal and equal LE:   No edema.       Assessment & Plan:  1.  Severe COPD:  As per Pulmonology  2.  Dyspnea with exertion:  Ruling out the possibility of cardiac element with cath.  Clonazepam for pre cath anxiety.

## 2016-07-16 NOTE — Telephone Encounter (Signed)
Called [employer] was approved with dates 07/10/16-07/05/17. Called [facility] and spoke with Raven and made her aware she stated their inhalers will come in at 3. Called pt and made him aware of approval and when the inhaler are suppose to be in. He will contact them around 3 to be able to pick up his inhaler. He had no further questions. Nothing further is needed

## 2016-07-16 NOTE — Progress Notes (Signed)
Spoke with pt and notified of results per Dr.McQuaid. Pt verbalized understanding and denied any questions. 

## 2016-07-18 ENCOUNTER — Ambulatory Visit (INDEPENDENT_AMBULATORY_CARE_PROVIDER_SITE_OTHER): Payer: Medicaid Other | Admitting: Acute Care

## 2016-07-18 ENCOUNTER — Encounter: Payer: Self-pay | Admitting: Acute Care

## 2016-07-18 DIAGNOSIS — J410 Simple chronic bronchitis: Secondary | ICD-10-CM

## 2016-07-18 DIAGNOSIS — B370 Candidal stomatitis: Secondary | ICD-10-CM

## 2016-07-18 MED ORDER — FLUTICASONE-UMECLIDIN-VILANT 100-62.5-25 MCG/INH IN AEPB: 1.0000 | INHALATION_SPRAY | Freq: Every day | RESPIRATORY_TRACT | 0 refills | Status: DC

## 2016-07-18 MED ORDER — UMECLIDINIUM BROMIDE 62.5 MCG/INH IN AEPB: 1.0000 | INHALATION_SPRAY | Freq: Every day | RESPIRATORY_TRACT | 5 refills | Status: DC

## 2016-07-18 MED ORDER — MAGIC MOUTHWASH: 5.0000 mL | Freq: Three times a day (TID) | ORAL | 0 refills | Status: DC

## 2016-07-18 NOTE — Assessment & Plan Note (Signed)
Severe COPD Therapeutic trial of Trelegy with excellent clinical benefit. Patient noted significant reduction in his dyspnea and increase in ability to exert himself. Cost is currently an issue Alternate plan to use Advair twice daily, with increased once daily Plan We will give you a card for a free 30 days of Trelegy. We will give you paperwork for financial assistance for the Trelegy. We will Send in prescriptions for Incruse in the event you cannot get the Trelegy.( Already has Advair) If you are unable to get the Trelegy, then use the Advair twice daily with the Incruse once daily. Rinse mouth after use. Magic Mouthwash 5 cc's three times daily x 7 days. Continue using your rescue inhaler as needed for shortness of breath or wheezing. Follow up in 3 months with Dr. Kendrick FriesMcQuaid to ensure you have the appropriate medication and are experiencing benefit. Please contact office for sooner follow up if symptoms do not improve or worsen or seek emergency care

## 2016-07-18 NOTE — Progress Notes (Addendum)
History of Present Illness Glenn Allison is a 61 y.o. male former smoker with COPD.He is followed by Dr. Kendrick Fries   07/20/2016 3 week follow up: Pt is here for follow up of initiation of treatment with Trelegy. He was started on Trelegy by Dr. Kendrick Fries 3 weeks ago for worsening shortness of breath and dyspnea.Pt. States the Trelegy works very well for him. He was able to walk without dyspnea for the first time in a long time. He is on Medicaid and the cost is prohibitive.He was started on Advair and Incruse as replacement.Pt has not tried this yet. He is using the Trelegy sample at present. This medication works well for the patient, however the cost is the issue here. He denies any chest pain, fever, orthopnea or hemoptysis. Of note the patient is scheduled for cardiac cath with Dr. Garnette Scheuermann on 07/24/2016.  Test Results:  CBC Latest Ref Rng & Units 07/19/2016 04/27/2016 07/05/2015  WBC 3.4 - 10.8 x10E3/uL 9.2 8.2 9.2  Hematocrit 37.5 - 51.0 % 45.0 45.7 46.9  Platelets 150 - 379 x10E3/uL 279 265 336    BMP Latest Ref Rng & Units 07/20/2016 07/19/2016 06/15/2016  Glucose 65 - 99 mg/dL - 161(W) 84  BUN 6 - 24 mg/dL - 14 16  Creatinine 9.60 - 1.27 mg/dL - 4.54 0.98  BUN/Creat Ratio 9 - 20 - 15 17  Sodium 134 - 144 mmol/L - 140 138  Potassium 3.5 - 5.2 mmol/L 4.7 5.7(H) 4.2  Chloride 96 - 106 mmol/L - 98 97  CO2 18 - 29 mmol/L - 24 26  Calcium 8.7 - 10.2 mg/dL - 9.9 9.6     ProBNP    Component Value Date/Time   PROBNP 33 06/15/2016 1039    PFT    Component Value Date/Time   FEV1PRE 0.62 06/20/2016 0822   FEV1POST 0.70 06/20/2016 0822   FVCPRE 2.22 06/20/2016 0822   FVCPOST 2.62 06/20/2016 0822   TLC 8.07 06/20/2016 0822   DLCOUNC 13.12 06/20/2016 0822   PREFEV1FVCRT 28 06/20/2016 0822   PSTFEV1FVCRT 27 06/20/2016 0822    Dg Chest 2 View  Result Date: 06/27/2016 CLINICAL DATA:  Dyspnea on exertion.  Smoker.  COPD EXAM: CHEST  2 VIEW COMPARISON:  None. FINDINGS: Pulmonary  hyperinflation with changes of COPD. Lungs are clear without infiltrate effusion or mass. Negative for heart failure. Heart size within normal limits. IMPRESSION: COPD without acute abnormality. Electronically Signed   By: Marlan Palau M.D.   On: 06/27/2016 07:23     Past medical hx Past Medical History:  Diagnosis Date  . COPD (chronic obstructive pulmonary disease) (HCC) 06/01/2016   Greater than 50-pack-year smoking history  . Hyperlipidemia   . Hypertension      Social History  Substance Use Topics  . Smoking status: Former Smoker    Packs/day: 1.50    Years: 40.00    Types: Cigarettes    Start date: 12/01/1972    Quit date: 03/12/2012  . Smokeless tobacco: Never Used  . Alcohol use 0.6 oz/week    1 Standard drinks or equivalent per week     Comment: socially    Tobacco Cessation: Patient is a former smoker with a 60-pack-year smoking history. Quit date was January 2014.  Past surgical hx, Family hx, Social hx all reviewed.  Current Outpatient Prescriptions on File Prior to Visit  Medication Sig  . albuterol (PROVENTIL HFA;VENTOLIN HFA) 108 (90 Base) MCG/ACT inhaler Inhale 2 puffs into the lungs every 6 (  six) hours as needed for wheezing or shortness of breath.  Marland Kitchen. amLODipine (NORVASC) 5 MG tablet Take 1 tablet (5 mg total) by mouth daily.  Marland Kitchen. aspirin EC 81 MG tablet Take 1 tablet (81 mg total) by mouth daily.  . budesonide-formoterol (SYMBICORT) 160-4.5 MCG/ACT inhaler Inhale 2 puffs into the lungs 2 (two) times daily. (Patient not taking: Reported on 07/19/2016)  . citalopram (CELEXA) 20 MG tablet Take 1 tablet (20 mg total) by mouth daily.  . clonazePAM (KLONOPIN) 0.5 MG tablet Take 1 tablet (0.5 mg total) by mouth 2 (two) times daily as needed for anxiety.  Marland Kitchen. DIOVAN 80 MG tablet Take 1 tablet (80 mg total) by mouth daily.  . hydrochlorothiazide (MICROZIDE) 12.5 MG capsule Take 1 capsule (12.5 mg total) by mouth daily.  . metoprolol succinate (TOPROL-XL) 100 MG 24 hr  tablet Take 1 tablet (100 mg total) by mouth daily. Take with or immediately following a meal.  . nitroGLYCERIN (NITROSTAT) 0.4 MG SL tablet 1 tab sublingual for significant dyspnea, may repeat every 5 minutes x2 if not relieved  . Fluticasone-Salmeterol (ADVAIR DISKUS) 250-50 MCG/DOSE AEPB Inhale 1 puff into the lungs 2 (two) times daily.  Marland Kitchen. OVER THE COUNTER MEDICATION Take 1 tablet by mouth at bedtime as needed (sleep). Kavinace   No current facility-administered medications on file prior to visit.      No Known Allergies  Review Of Systems:  Constitutional:   No  weight loss, night sweats,  Fevers, chills, fatigue, or  lassitude.  HEENT:   No headaches,  Difficulty swallowing,  Tooth/dental problems, or  Sore throat,                No sneezing, itching, ear ache, nasal congestion, post nasal drip,   CV:  No chest pain,  Orthopnea, PND, swelling in lower extremities, anasarca, dizziness, palpitations, syncope.   GI  No heartburn, indigestion, abdominal pain, nausea, vomiting, diarrhea, change in bowel habits, loss of appetite, bloody stools.   Resp: + shortness of breath with exertion or at rest.  No excess mucus, baseline  productive cough,  No non-productive cough,  No coughing up of blood.  No change in color of mucus.  No wheezing.  No chest wall deformity  Skin: no rash or lesions.  GU: no dysuria, change in color of urine, no urgency or frequency.  No flank pain, no hematuria   MS:  No joint pain or swelling.  No decreased range of motion.  No back pain.  Psych:  No change in mood or affect. No depression or anxiety.  No memory loss.   Vital Signs BP 114/72 (BP Location: Right Arm, Patient Position: Sitting, Cuff Size: Normal)   Pulse 61   Ht 5\' 10"  (1.778 m)   Wt 176 lb 9.6 oz (80.1 kg)   SpO2 94%   BMI 25.34 kg/m    Physical Exam:  General- No distress,  A&Ox , pleasant ENT: No sinus tenderness, TM clear, pale nasal mucosa, no oral exudate,no post nasal drip, no  LAN Cardiac: S1, S2, regular rate and rhythm, no murmur Chest: No wheeze/ rales/ dullness; no accessory muscle use, no nasal flaring, no sternal retractions Abd.: Soft Non-tender Ext: No clubbing cyanosis, edema Neuro:  normal strength Skin: No rashes, warm and dry Psych: normal mood and behavior   Assessment/Plan  COPD (chronic obstructive pulmonary disease) (HCC) Severe COPD Therapeutic trial of Trelegy with excellent clinical benefit. Patient noted significant reduction in his dyspnea and increase in ability  to exert himself. Cost is currently an issue Alternate plan to use Advair twice daily, with increased once daily Plan We will give you a card for a free 30 days of Trelegy. We will give you paperwork for financial assistance for the Trelegy. We will Send in prescriptions for Incruse in the event you cannot get the Trelegy.( Already has Advair) If you are unable to get the Trelegy, then use the Advair twice daily with the Incruse once daily. Rinse mouth after use. Magic Mouthwash 5 cc's three times daily x 7 days. Continue using your rescue inhaler as needed for shortness of breath or wheezing. Follow up in 3 months with Dr. Kendrick Fries to ensure you have the appropriate medication and are experiencing benefit. Please contact office for sooner follow up if symptoms do not improve or worsen or seek emergency care     Oral thrush Magic Mouthwash 5 cc's three times daily x 7 days. Please contact office for sooner follow up if symptoms do not improve or worsen or seek emergency care       Bevelyn Ngo, NP 07/20/2016  1:13 PM

## 2016-07-18 NOTE — Assessment & Plan Note (Signed)
Magic Mouthwash 5 cc's three times daily x 7 days. Please contact office for sooner follow up if symptoms do not improve or worsen or seek emergency care

## 2016-07-18 NOTE — Patient Instructions (Addendum)
It is nice to meet you today. We will give you a card for a free 30 days of Trelegy. We will give you paperwork for financial assistance for the Trelegy. We will Send in prescriptions for Incruse in the event you cannot get the Trelegy. If you are unable to get the Trelegy, then use the Advair twice daily with the Incruse once daily. Rinse mouth after use. Magic Mouthwash 5 cc's three times daily x 7 days. Continue using your rescue inhaler as needed for shortness of breath or wheezing. Follow up in 3 months with Dr. Kendrick FriesMcQuaid to ensure you have the appropriate medication and are experiencing benefit. Please contact office for sooner follow up if symptoms do not improve or worsen or seek emergency care

## 2016-07-19 ENCOUNTER — Other Ambulatory Visit: Payer: Medicaid Other | Admitting: *Deleted

## 2016-07-19 DIAGNOSIS — J410 Simple chronic bronchitis: Secondary | ICD-10-CM

## 2016-07-19 DIAGNOSIS — F17210 Nicotine dependence, cigarettes, uncomplicated: Secondary | ICD-10-CM

## 2016-07-19 DIAGNOSIS — R0789 Other chest pain: Secondary | ICD-10-CM

## 2016-07-19 DIAGNOSIS — I10 Essential (primary) hypertension: Secondary | ICD-10-CM

## 2016-07-19 LAB — BASIC METABOLIC PANEL
BUN/Creatinine Ratio: 15 (ref 9–20)
BUN: 14 mg/dL (ref 6–24)
CO2: 24 mmol/L (ref 18–29)
CREATININE: 0.94 mg/dL (ref 0.76–1.27)
Calcium: 9.9 mg/dL (ref 8.7–10.2)
Chloride: 98 mmol/L (ref 96–106)
GFR, EST AFRICAN AMERICAN: 102 mL/min/{1.73_m2} (ref >59)
GFR, EST NON AFRICAN AMERICAN: 88 mL/min/{1.73_m2} (ref >59)
Glucose: 102 mg/dL — ABNORMAL HIGH (ref 65–99)
Potassium: 5.7 mmol/L — ABNORMAL HIGH (ref 3.5–5.2)
Sodium: 140 mmol/L (ref 134–144)

## 2016-07-19 NOTE — Progress Notes (Signed)
Reviewed, agree 

## 2016-07-19 NOTE — Addendum Note (Signed)
Addended by: Tonita PhoenixBOWDEN, Jozie Wulf K on: 07/19/2016 08:22 AM   Modules accepted: Orders

## 2016-07-20 ENCOUNTER — Telehealth: Payer: Self-pay

## 2016-07-20 ENCOUNTER — Other Ambulatory Visit: Payer: Self-pay | Admitting: *Deleted

## 2016-07-20 ENCOUNTER — Telehealth: Payer: Self-pay | Admitting: Interventional Cardiology

## 2016-07-20 ENCOUNTER — Other Ambulatory Visit: Payer: Medicaid Other | Admitting: *Deleted

## 2016-07-20 DIAGNOSIS — E875 Hyperkalemia: Secondary | ICD-10-CM

## 2016-07-20 LAB — CBC WITH DIFFERENTIAL/PLATELET
Basophils Absolute: 0 10*3/uL (ref 0.0–0.2)
Basos: 0 %
EOS (ABSOLUTE): 0.1 10*3/uL (ref 0.0–0.4)
EOS: 2 %
HEMOGLOBIN: 15.3 g/dL (ref 13.0–17.7)
Hematocrit: 45 % (ref 37.5–51.0)
IMMATURE GRANS (ABS): 0.1 10*3/uL (ref 0.0–0.1)
Immature Granulocytes: 1 %
LYMPHS: 33 %
Lymphocytes Absolute: 3.1 10*3/uL (ref 0.7–3.1)
MCH: 30.7 pg (ref 26.6–33.0)
MCHC: 34 g/dL (ref 31.5–35.7)
MCV: 90 fL (ref 79–97)
MONOCYTES: 11 %
Monocytes Absolute: 1 10*3/uL — ABNORMAL HIGH (ref 0.1–0.9)
NEUTROS ABS: 5 10*3/uL (ref 1.4–7.0)
Neutrophils: 53 %
Platelets: 279 10*3/uL (ref 150–379)
RBC: 4.98 x10E6/uL (ref 4.14–5.80)
RDW: 13.6 % (ref 12.3–15.4)
WBC: 9.2 10*3/uL (ref 3.4–10.8)

## 2016-07-20 LAB — POTASSIUM: POTASSIUM: 4.7 mmol/L (ref 3.5–5.2)

## 2016-07-20 LAB — PROTIME-INR
INR: 1 (ref 0.8–1.2)
PROTHROMBIN TIME: 11 s (ref 9.1–12.0)

## 2016-07-20 NOTE — Telephone Encounter (Signed)
Please send a prescription for the Spiriva Respimat  2 puffs once daily in place of the Incruse. He needs to use the Spiriva Respimat with the Advair to have the same effect as the Trelegy. Have him rinse his mouth after use. Please call patient and let him know.  Thank you

## 2016-07-20 NOTE — Telephone Encounter (Signed)
PA started for Incruse but CSR advised the pt has to try Spiriva or stiolto first before they will cover Incruse. Maralyn SagoSarah did you want to change to one of these inhalers?

## 2016-07-20 NOTE — Telephone Encounter (Signed)
Spoke with patient about lab (potassium) done today.

## 2016-07-20 NOTE — Telephone Encounter (Signed)
New message    Pt is returning Ann's call

## 2016-07-24 ENCOUNTER — Ambulatory Visit (HOSPITAL_COMMUNITY)
Admission: RE | Admit: 2016-07-24 | Discharge: 2016-07-24 | Disposition: A | Discharge status: Home / Self Care | Payer: Medicaid Other | Source: Ambulatory Visit | Attending: Interventional Cardiology | Admitting: Interventional Cardiology

## 2016-07-24 ENCOUNTER — Encounter (HOSPITAL_COMMUNITY)
Admission: RE | Disposition: A | Discharge status: Home / Self Care | Payer: Self-pay | Source: Ambulatory Visit | Attending: Interventional Cardiology

## 2016-07-24 DIAGNOSIS — I25119 Atherosclerotic heart disease of native coronary artery with unspecified angina pectoris: Secondary | ICD-10-CM | POA: Insufficient documentation

## 2016-07-24 DIAGNOSIS — E785 Hyperlipidemia, unspecified: Secondary | ICD-10-CM | POA: Diagnosis not present

## 2016-07-24 DIAGNOSIS — J449 Chronic obstructive pulmonary disease, unspecified: Secondary | ICD-10-CM | POA: Diagnosis present

## 2016-07-24 DIAGNOSIS — Z87891 Personal history of nicotine dependence: Secondary | ICD-10-CM | POA: Diagnosis not present

## 2016-07-24 DIAGNOSIS — Z7951 Long term (current) use of inhaled steroids: Secondary | ICD-10-CM | POA: Diagnosis not present

## 2016-07-24 DIAGNOSIS — F17210 Nicotine dependence, cigarettes, uncomplicated: Secondary | ICD-10-CM | POA: Diagnosis present

## 2016-07-24 DIAGNOSIS — I2510 Atherosclerotic heart disease of native coronary artery without angina pectoris: Secondary | ICD-10-CM | POA: Diagnosis not present

## 2016-07-24 DIAGNOSIS — Z7982 Long term (current) use of aspirin: Secondary | ICD-10-CM | POA: Insufficient documentation

## 2016-07-24 DIAGNOSIS — I10 Essential (primary) hypertension: Secondary | ICD-10-CM | POA: Diagnosis present

## 2016-07-24 DIAGNOSIS — R0789 Other chest pain: Secondary | ICD-10-CM | POA: Diagnosis present

## 2016-07-24 HISTORY — PX: LEFT HEART CATH AND CORONARY ANGIOGRAPHY: CATH118249

## 2016-07-24 SURGERY — LEFT HEART CATH AND CORONARY ANGIOGRAPHY
Anesthesia: LOCAL

## 2016-07-24 MED ORDER — SODIUM CHLORIDE 0.9 % IV SOLN: 250.0000 mL | INTRAVENOUS | Status: DC | PRN

## 2016-07-24 MED ORDER — SODIUM CHLORIDE 0.9% FLUSH: 3.0000 mL | Freq: Two times a day (BID) | INTRAVENOUS | Status: DC

## 2016-07-24 MED ORDER — SODIUM CHLORIDE 0.9% FLUSH: 3.0000 mL | INTRAVENOUS | Status: DC | PRN

## 2016-07-24 MED ORDER — SODIUM CHLORIDE 0.9 % IV SOLN
INTRAVENOUS | Status: DC
  Administered 2016-07-24: 14:00:00 via INTRAVENOUS

## 2016-07-24 MED ORDER — LIDOCAINE HCL (PF) 1 % IJ SOLN
INTRAMUSCULAR | Status: DC | PRN
  Administered 2016-07-24: 2 mL

## 2016-07-24 MED ORDER — HEPARIN (PORCINE) IN NACL 2-0.9 UNIT/ML-% IJ SOLN
INTRAMUSCULAR | Status: AC | PRN
  Administered 2016-07-24: 1000 mL

## 2016-07-24 MED ORDER — FENTANYL CITRATE (PF) 100 MCG/2ML IJ SOLN
INTRAMUSCULAR | Status: DC | PRN
  Administered 2016-07-24: 50 ug via INTRAVENOUS

## 2016-07-24 MED ORDER — FENTANYL CITRATE (PF) 100 MCG/2ML IJ SOLN
INTRAMUSCULAR | Status: AC
Start: 2016-07-24 — End: ?
  Filled 2016-07-24: qty 2

## 2016-07-24 MED ORDER — HEPARIN (PORCINE) IN NACL 2-0.9 UNIT/ML-% IJ SOLN
INTRAMUSCULAR | Status: AC
Start: 2016-07-24 — End: ?
  Filled 2016-07-24: qty 1000

## 2016-07-24 MED ORDER — OXYCODONE-ACETAMINOPHEN 5-325 MG PO TABS: 1.0000 | ORAL_TABLET | ORAL | Status: DC | PRN

## 2016-07-24 MED ORDER — IOPAMIDOL (ISOVUE-370) INJECTION 76%
INTRAVENOUS | Status: AC
  Filled 2016-07-24: qty 50

## 2016-07-24 MED ORDER — ASPIRIN 81 MG PO CHEW: 81.0000 mg | CHEWABLE_TABLET | Freq: Every day | ORAL | Status: DC

## 2016-07-24 MED ORDER — ONDANSETRON HCL 4 MG/2ML IJ SOLN: 4.0000 mg | Freq: Four times a day (QID) | INTRAMUSCULAR | Status: DC | PRN

## 2016-07-24 MED ORDER — HEPARIN SODIUM (PORCINE) 1000 UNIT/ML IJ SOLN
INTRAMUSCULAR | Status: AC
  Filled 2016-07-24: qty 1

## 2016-07-24 MED ORDER — SODIUM CHLORIDE 0.9 % WEIGHT BASED INFUSION: 1.0000 mL/kg/h | INTRAVENOUS | Status: DC

## 2016-07-24 MED ORDER — ASPIRIN 81 MG PO CHEW: 81.0000 mg | CHEWABLE_TABLET | ORAL | Status: DC

## 2016-07-24 MED ORDER — MIDAZOLAM HCL 2 MG/2ML IJ SOLN
INTRAMUSCULAR | Status: AC
  Filled 2016-07-24: qty 2

## 2016-07-24 MED ORDER — IOPAMIDOL (ISOVUE-370) INJECTION 76%
INTRAVENOUS | Status: DC | PRN
  Administered 2016-07-24: 110 mL via INTRA_ARTERIAL

## 2016-07-24 MED ORDER — IOPAMIDOL (ISOVUE-370) INJECTION 76%
INTRAVENOUS | Status: AC
  Filled 2016-07-24: qty 100

## 2016-07-24 MED ORDER — MIDAZOLAM HCL 2 MG/2ML IJ SOLN
INTRAMUSCULAR | Status: DC | PRN
  Administered 2016-07-24: 2 mg via INTRAVENOUS

## 2016-07-24 MED ORDER — LIDOCAINE HCL (PF) 1 % IJ SOLN
INTRAMUSCULAR | Status: AC
  Filled 2016-07-24: qty 30

## 2016-07-24 MED ORDER — SODIUM CHLORIDE 0.9 % WEIGHT BASED INFUSION
3.0000 mL/kg/h | INTRAVENOUS | Status: AC
  Administered 2016-07-24: 3 mL/kg/h via INTRAVENOUS

## 2016-07-24 MED ORDER — VERAPAMIL HCL 2.5 MG/ML IV SOLN
INTRAVENOUS | Status: DC | PRN
  Administered 2016-07-24: 13:00:00 via INTRA_ARTERIAL

## 2016-07-24 MED ORDER — HEPARIN SODIUM (PORCINE) 1000 UNIT/ML IJ SOLN
INTRAMUSCULAR | Status: DC | PRN
  Administered 2016-07-24: 4000 [IU] via INTRAVENOUS

## 2016-07-24 MED ORDER — VERAPAMIL HCL 2.5 MG/ML IV SOLN
INTRAVENOUS | Status: AC
  Filled 2016-07-24: qty 2

## 2016-07-24 MED ORDER — ACETAMINOPHEN 325 MG PO TABS: 650.0000 mg | ORAL_TABLET | ORAL | Status: DC | PRN

## 2016-07-24 SURGICAL SUPPLY — 15 items
CATH INFINITI 5 FR JL3.5 (CATHETERS) ×2 IMPLANT
CATH INFINITI JR4 5F (CATHETERS) ×2 IMPLANT
COVER PRB 48X5XTLSCP FOLD TPE (BAG) ×1 IMPLANT
COVER PROBE 5X48 (BAG) ×1
DEVICE RAD COMP TR BAND LRG (VASCULAR PRODUCTS) ×2 IMPLANT
GLIDESHEATH SLEND A-KIT 6F 22G (SHEATH) ×2 IMPLANT
GUIDEWIRE INQWIRE 1.5J.035X260 (WIRE) ×1 IMPLANT
INQWIRE 1.5J .035X260CM (WIRE) ×2
KIT ESSENTIALS PG (KITS) ×2 IMPLANT
KIT HEART LEFT (KITS) ×2 IMPLANT
PACK CARDIAC CATHETERIZATION (CUSTOM PROCEDURE TRAY) ×2 IMPLANT
TRANSDUCER W/STOPCOCK (MISCELLANEOUS) ×2 IMPLANT
TUBING CIL FLEX 10 FLL-RA (TUBING) ×2 IMPLANT
WIRE ASAHI PROWATER 180CM (WIRE) ×2 IMPLANT
WIRE HI TORQ VERSACORE-J 145CM (WIRE) ×2 IMPLANT

## 2016-07-24 NOTE — Interval H&P Note (Signed)
Cath Lab Visit (complete for each Cath Lab visit)  Clinical Evaluation Leading to the Procedure:   ACS: No.  Non-ACS:    Anginal Classification: CCS Allison  Anti-ischemic medical therapy: Minimal Therapy (1 class of medications)  Non-Invasive Test Results: No non-invasive testing performed  Prior CABG: No previous CABG      History and Physical Interval Note:  07/24/2016 12:24 PM  Glenn Allison  has presented today for surgery, with the diagnosis of angina - cad  The various methods of treatment have been discussed with the patient and family. After consideration of risks, benefits and other options for treatment, the patient has consented to  Procedure(s): Left Heart Cath and Coronary Angiography (N/A) as a surgical intervention .  The patient's history has been reviewed, patient examined, no change in status, stable for surgery.  I have reviewed the patient's chart and labs.  Questions were answered to the patient's satisfaction.     Lyn RecordsHenry W Smith Allison

## 2016-07-24 NOTE — H&P (View-Only) (Signed)
Cardiology Office Note    Date:  07/12/2016   ID:  Glenn Allison, DOB 1956-01-03, MRN [id number]  PCP:  Mack Hook, MD  Cardiologist: Sinclair Grooms, MD   Chief Complaint  Patient presents with  . Coronary Artery Disease  . Shortness of Breath    History of Present Illness:  Glenn Allison is a 61 y.o. male referred from the [facility] by Dr. Jacqualine Code for evaluation of dyspnea and chest pressure. He evaluation to this point has included pulmonary function testing which demonstrated severe COPD.  Evaluation has included pulmonary function testing which demonstrated severe obstructive pulmonary disease with less than 50% lung capacity. He has seen Dr. Curt Jews and is on bronchodilator therapy.  Stress testing was performed but very brief duration of exercise and decreases the sensitivity of the test to identify coronary disease. His symptoms associated at coronary disease is highly suspected. He gets tightness in the chest with mild to moderate activity. The discomfort radiates into the left shoulder. Nitroglycerin has relieved the discomfort in the past. The tightness and exertional dyspnea seemed to be dissociated. Though he had no EKG changes on stress testing he did develop a tightness in his chest.  Past Medical History:  Diagnosis Date  . COPD (chronic obstructive pulmonary disease) ([place]) 06/01/2016   Greater than 50-pack-year smoking history  . Hyperlipidemia   . Hypertension     Past Surgical History:  Procedure Laterality Date  . None      Current Medications: Outpatient Medications Prior to Visit  Medication Sig Dispense Refill  . albuterol (PROVENTIL HFA;VENTOLIN HFA) 108 (90 Base) MCG/ACT inhaler Inhale 2 puffs into the lungs every 6 (six) hours as needed for wheezing or shortness of breath. 1 Inhaler 5  . amLODipine (NORVASC) 5 MG tablet Take 1 tablet (5 mg total) by mouth daily. 30 tablet 11  . aspirin EC 81 MG tablet Take 1 tablet (81 mg  total) by mouth daily.    . budesonide-formoterol (SYMBICORT) 160-4.5 MCG/ACT inhaler Inhale 2 puffs into the lungs 2 (two) times daily. 1 Inhaler 11  . citalopram (CELEXA) 20 MG tablet Take 1 tablet (20 mg total) by mouth daily. 30 tablet 11  . clonazePAM (KLONOPIN) 0.5 MG tablet Take 1 tablet (0.5 mg total) by mouth 2 (two) times daily as needed for anxiety. 10 tablet 0  . Fluticasone-Umeclidin-Vilant (TRELEGY ELLIPTA) 100-62.5-25 MCG/INH AEPB Inhale 1 puff into the lungs daily. 60 each 5  . hydrochlorothiazide (MICROZIDE) 12.5 MG capsule Take 1 capsule (12.5 mg total) by mouth daily. 90 capsule 3  . metoprolol succinate (TOPROL-XL) 100 MG 24 hr tablet Take 1 tablet (100 mg total) by mouth daily. Take with or immediately following a meal. 30 tablet 11  . nitroGLYCERIN (NITROSTAT) 0.4 MG SL tablet 1 tab sublingual for significant dyspnea, may repeat every 5 minutes x2 if not relieved 25 tablet 1   No facility-administered medications prior to visit.      Allergies:   Patient has no known allergies.   Social History   Social History  . Marital status: Single    Spouse name: N/A  . Number of children: 0  . Years of education: GTI   Occupational History  . unemployed     Was a Adult nurse   Social History Main Topics  . Smoking status: Former Smoker    Packs/day: 1.50    Years: 40.00    Types: Cigarettes    Start date: 12/01/1972  Quit date: 03/12/2012  . Smokeless tobacco: Never Used  . Alcohol use 0.6 oz/week    1 Standard drinks or equivalent per week     Comment: socially  . Drug use: No  . Sexual activity: Yes    Partners: Female   Other Topics Concern  . 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   Grew up in [place] and graduated from [employer].   Made living in [place].   Cared for his mother for 10 years and has ignored his own health.   Not seen by a doctor for 16 years.      Family History:  The patient's family history includes Alcohol abuse in his father; Diabetes in his mother; Heart disease in his mother; Hyperlipidemia in his brother and mother; Hypertension in his mother.   ROS:   Please see the history of present illness.    Orthopnea, dizziness, anxiety, snoring, wheezing, and excessive fatigue.  All other systems reviewed and are negative.   PHYSICAL EXAM:   VS:  BP (!) 164/96 (BP Location: Left Arm)   Pulse 66   Ht 6' (1.829 m)   Wt 178 lb 3.2 oz (80.8 kg)   BMI 24.17 kg/m    GEN: Well nourished, well developed, in no acute distress  HEENT: normal  Neck: no JVD, carotid bruits, or masses Cardiac: RRR; no murmurs, rubs, or gallops,no edema  Respiratory: Breath sounds are diminished. GI: soft, nontender, nondistended, + BS MS: no deformity or atrophy  Skin: warm and dry, no rash Neuro:  Alert and Oriented x 3, Strength and sensation are intact Psych: euthymic mood, full affect  Wt Readings from Last 3 Encounters:  07/12/16 178 lb 3.2 oz (80.8 kg)  06/26/16 178 lb (80.7 kg)  06/01/16 175 lb 6.4 oz (79.6 kg)      Studies/Labs Reviewed:   EKG:  EKG  Not repeated. Sinus bradycardia without significant abnormality noted on most recent EKG of February 2018.  Recent Labs: 04/27/2016: ALT 18; Platelets 265 06/15/2016: BUN 16; Creatinine, Ser 0.95; NT-Pro BNP 33; Potassium 4.2; Sodium 138   Lipid Panel    Component Value Date/Time   CHOL 231 (H) 04/27/2016 0842   TRIG 66 04/27/2016 0842   HDL 72 04/27/2016 0842   LDLCALC 146 (H) 04/27/2016 0842    Additional studies/ records that were reviewed today include:   Pulmonary function testing, 06/20/2016:  Pulmonary Function Diagnosis: Severe Obstructive Airways Disease Response to bronchodilator Severe Diffusion Defect Airtrapping and probable over-inflation   STRESS TEST 06/15/16:  Study Highlights     Blood pressure demonstrated a hypertensive response to exercise.  There  was no ST segment deviation noted during stress.   Non diagnostic ETT Patient only achieved 57% of PMHR HTN response to exercise       ASSESSMENT:    1. Angina pectoris ([place])   2. Essential hypertension   3. Simple chronic bronchitis (HCC)      PLAN:  In order of problems listed above:  1. The patient's stress test was nondiagnostic due to inability to achieve target heart rate. I have recommended coronary angiography. The patient was counseled to undergo left heart catheterization, coronary angiography, and possible percutaneous coronary intervention with stent implantation. The procedural risks and benefits were discussed in detail. The risks discussed included death, stroke, myocardial infarction, life-threatening bleeding, limb ischemia, kidney injury, allergy, and possible  emergency cardiac surgery. The risk of these significant complications were estimated to occur less than 1% of the time. After discussion, the patient has agreed to proceed.  2.  Add Diovan/valsartan 80 mg daily. Be met in one week. He eventually consolidate to valsartan HCTZ. Target blood pressure 140/90 or less.  3.  Severe COPD by PFTs. Pulmonary rehabilitation is been recommended by Dr. Curt Jews. Before I can feel comfortable with freely clearing him to exercise, we need to exclude the possibility of severe underlying CAD   Medication Adjustments/Labs and Tests Ordered: Current medicines are reviewed at length with the patient today.  Concerns regarding medicines are outlined above.  Medication changes, Labs and Tests ordered today are listed in the Patient Instructions below. There are no Patient Instructions on file for this visit.   Signed, Sinclair Grooms, MD  07/12/2016 9:21 AM    [facility] [address], [place], [place]  [address] Phone: [phone]; Fax: [phone]

## 2016-07-24 NOTE — Discharge Instructions (Signed)
Radial Site Care °Refer to this sheet in the next few weeks. These instructions provide you with information about caring for yourself after your procedure. Your health care provider may also give you more specific instructions. Your treatment has been planned according to current medical practices, but problems sometimes occur. Call your health care provider if you have any problems or questions after your procedure. °What can I expect after the procedure? °After your procedure, it is typical to have the following: °· Bruising at the radial site that usually fades within 1-2 weeks. °· Blood collecting in the tissue (hematoma) that may be painful to the touch. It should usually decrease in size and tenderness within 1-2 weeks. °Follow these instructions at home: °· Take medicines only as directed by your health care provider. °· You may shower 24-48 hours after the procedure or as directed by your health care provider. Remove the bandage (dressing) and gently wash the site with plain soap and water. Pat the area dry with a clean towel. Do not rub the site, because this may cause bleeding. °· Do not take baths, swim, or use a hot tub until your health care provider approves. °· Check your insertion site every day for redness, swelling, or drainage. °· Do not apply powder or lotion to the site. °· Do not flex or bend the affected arm for 24 hours or as directed by your health care provider. °· Do not push or pull heavy objects with the affected arm for 24 hours or as directed by your health care provider. °· Do not lift over 10 lb (4.5 kg) for 5 days after your procedure or as directed by your health care provider. °· Ask your health care provider when it is okay to: °¨ Return to work or school. °¨ Resume usual physical activities or sports. °¨ Resume sexual activity. °· Do not drive home if you are discharged the same day as the procedure. Have someone else drive you. °· You may drive 24 hours after the procedure  unless otherwise instructed by your health care provider. °· Do not operate machinery or power tools for 24 hours after the procedure. °· If your procedure was done as an outpatient procedure, which means that you went home the same day as your procedure, a responsible adult should be with you for the first 24 hours after you arrive home. °· Keep all follow-up visits as directed by your health care provider. This is important. °Contact a health care provider if: °· You have a fever. °· You have chills. °· You have increased bleeding from the radial site. Hold pressure on the site. °Get help right away if: °· You have unusual pain at the radial site. °· You have redness, warmth, or swelling at the radial site. °· You have drainage (other than a small amount of blood on the dressing) from the radial site. °· The radial site is bleeding, and the bleeding does not stop after 30 minutes of holding steady pressure on the site. °· Your arm or hand becomes pale, cool, tingly, or numb. °This information is not intended to replace advice given to you by your health care provider. Make sure you discuss any questions you have with your health care provider. °Document Released: 03/31/2010 Document Revised: 08/04/2015 Document Reviewed: 09/14/2013 °Elsevier Interactive Patient Education © 2017 [employer] ° °

## 2016-07-25 ENCOUNTER — Encounter (HOSPITAL_COMMUNITY): Payer: Self-pay | Admitting: Interventional Cardiology

## 2016-07-25 ENCOUNTER — Telehealth: Payer: Self-pay | Admitting: Pulmonary Disease

## 2016-07-25 NOTE — Telephone Encounter (Signed)
lmtcb X1 for pt  

## 2016-07-26 ENCOUNTER — Telehealth: Payer: Self-pay

## 2016-07-26 MED ORDER — NYSTATIN 100000 UNIT/ML MT SUSP: OROMUCOSAL | 0 refills | Status: DC

## 2016-07-26 NOTE — Telephone Encounter (Signed)
Rx has been sent to preferred pharmacy. Pt aware and voiced his understanding. Nothing further needed.  

## 2016-07-26 NOTE — Telephone Encounter (Signed)
[facility] [address] called - states that Incruse needs prior auth -pr

## 2016-07-26 NOTE — Telephone Encounter (Signed)
Patient returned phone call..ert ° °

## 2016-07-26 NOTE — Telephone Encounter (Signed)
Please call in nystatin oral solution. 5 cc's 4 times daily x 7 days. Swish and spit.Thanks so much.

## 2016-07-26 NOTE — Telephone Encounter (Signed)
Patient called stating his pulmonologist prescribed him a medication for having thrush in his mouth from using his inhalers. States insurance said it is not covered. Pulmonologist office asked patient to get formulary list from insurance company. Patient wants you to send in  Another Rx for Fluconazole. States you had prescribed this for him in the past and thought it would be easier to get from you. I informed patient that he may need to go through pulmonology since they are treating his for this now. To Dr. Delrae AlfredMulberry for further direction.

## 2016-07-26 NOTE — Telephone Encounter (Signed)
Spoke with pt who states during his OV with SG on 07/18/16, he was prescribed magic mouthwash. Pt states this medication is not covered by his insurance. Pt states his PCP has previously prescribed Diflucan 100mg  for thrush. Pt also states incruse is not covered by his insurance. I have recommended that he contact his insurance company for a medication formulary and call us once received.  SG please advise. Thanks.

## 2016-07-27 MED ORDER — FLUCONAZOLE 100 MG PO TABS: ORAL_TABLET | ORAL | 0 refills | Status: DC

## 2016-07-27 NOTE — Telephone Encounter (Signed)
Spoke with patient.

## 2016-08-02 NOTE — Telephone Encounter (Signed)
Left message for pt to call back  °

## 2016-08-03 MED ORDER — TIOTROPIUM BROMIDE MONOHYDRATE 1.25 MCG/ACT IN AERS: 2.0000 | INHALATION_SPRAY | Freq: Every day | RESPIRATORY_TRACT | 11 refills | Status: DC

## 2016-08-03 MED ORDER — FLUTICASONE-SALMETEROL 250-50 MCG/DOSE IN AEPB: 1.0000 | INHALATION_SPRAY | Freq: Two times a day (BID) | RESPIRATORY_TRACT | 5 refills | Status: DC

## 2016-08-03 NOTE — Telephone Encounter (Signed)
Patient returning call - he can be reached at [phone] -pr

## 2016-08-03 NOTE — Telephone Encounter (Signed)
Called and spoke with pt and he is aware of SG recs.  The meds have been sent to the pharmacy and pt is aware.

## 2016-08-07 ENCOUNTER — Telehealth: Payer: Self-pay

## 2016-08-07 NOTE — Telephone Encounter (Signed)
Attemped PA for Spiriva Resp 1.25 through [employer] and it is pending review. It looks like [facility] has other alternatives for pt per previous message but I went ahead and started PA anyway to see if it can get approved. We will await response.

## 2016-08-21 NOTE — Telephone Encounter (Signed)
lmtcb x1 for pt. 

## 2016-08-21 NOTE — Telephone Encounter (Signed)
Spoke with pt. I apologized that he has not heard anything back from us about this. Advised him that I would get this message to [facility] to address. Pt has Medicaid. Spiriva Respimat is non-preferred for Medicaid. Covered alternatives are Atrovent, Spiriva Handihaler and Stiolto.  [facility] - please advise. Thanks.

## 2016-08-21 NOTE — Telephone Encounter (Signed)
Patient checking on prior auth on Spiriva - He can be reached at [phone] -pr

## 2016-08-21 NOTE — Telephone Encounter (Signed)
spiriva handihaler 1 puff daily

## 2016-08-21 NOTE — Progress Notes (Signed)
Cardiology Office Note    Date:  08/22/2016   ID:  Glenn Allison, DOB Dec 01, 1955, MRN [id number]  PCP:  Julieanne MansonMulberry, Elizabeth, MD  Cardiologist: Lesleigh NoeHenry W Smith III, MD   Chief Complaint  Patient presents with  . 1 month follow up  . Shortness of Breath    History of Present Illness:  Glenn Allison is a 61 y.o. male [facility] by Dr. Marland McalpineE. Mulberry for evaluation of dyspnea and chest pressure. Documented poorly treated essential hypertension. Since that time documented to have severe COPD. Cardiac catheterization demonstrated minimal CAD, normal LV function, and normal end-diastolic pressure.  He feels better. He still has significant dyspnea on exertion. This is slightly improved. We discussed low-salt diet. He has occasional palpitations that seem to be correlated with use of his bronchodilator therapy. No peripheral edema. No cough or phlegm production.  Past Medical History:  Diagnosis Date  . COPD (chronic obstructive pulmonary disease) (HCC) 06/01/2016   Greater than 50-pack-year smoking history  . Hyperlipidemia   . Hypertension     Past Surgical History:  Procedure Laterality Date  . LEFT HEART CATH AND CORONARY ANGIOGRAPHY N/A 07/24/2016   Procedure: Left Heart Cath and Coronary Angiography;  Surgeon: Lyn RecordsSmith, Henry W, MD;  Location: [facility] INVASIVE CV LAB;  Service: Cardiovascular;  Laterality: N/A;  . None      Current Medications: Outpatient Medications Prior to Visit  Medication Sig Dispense Refill  . albuterol (PROVENTIL HFA;VENTOLIN HFA) 108 (90 Base) MCG/ACT inhaler Inhale 2 puffs into the lungs every 6 (six) hours as needed for wheezing or shortness of breath. 1 Inhaler 5  . amLODipine (NORVASC) 5 MG tablet Take 1 tablet (5 mg total) by mouth daily. 30 tablet 11  . aspirin EC 81 MG tablet Take 1 tablet (81 mg total) by mouth daily.    . citalopram (CELEXA) 20 MG tablet Take 1 tablet (20 mg total) by mouth daily. 30 tablet 11  . clonazePAM  (KLONOPIN) 0.5 MG tablet Take 1 tablet (0.5 mg total) by mouth 2 (two) times daily as needed for anxiety. 20 tablet 0  . DIOVAN 80 MG tablet Take 1 tablet (80 mg total) by mouth daily. 90 tablet 3  . Fluticasone-Salmeterol (ADVAIR DISKUS) 250-50 MCG/DOSE AEPB Inhale 1 puff into the lungs 2 (two) times daily. 60 each 5  . hydrochlorothiazide (MICROZIDE) 12.5 MG capsule Take 1 capsule (12.5 mg total) by mouth daily. 90 capsule 3  . metoprolol succinate (TOPROL-XL) 100 MG 24 hr tablet Take 1 tablet (100 mg total) by mouth daily. Take with or immediately following a meal. 30 tablet 11  . nitroGLYCERIN (NITROSTAT) 0.4 MG SL tablet 1 tab sublingual for significant dyspnea, may repeat every 5 minutes x2 if not relieved 25 tablet 1  . nystatin (MYCOSTATIN) 100000 UNIT/ML suspension Take 5ml 4 times daily for 7 days swish and spit. 60 mL 0  . OVER THE COUNTER MEDICATION Take 1 tablet by mouth at bedtime as needed (sleep). Kavinace    . budesonide-formoterol (SYMBICORT) 160-4.5 MCG/ACT inhaler Inhale 2 puffs into the lungs 2 (two) times daily. (Patient not taking: Reported on 07/19/2016) 1 Inhaler 11  . fluconazole (DIFLUCAN) 100 MG tablet 2 tabs by mouth today, then 1 tab by mouth for 13 more days. (Patient not taking: Reported on 08/22/2016) 15 tablet 0  . Fluticasone-Umeclidin-Vilant (TRELEGY ELLIPTA) 100-62.5-25 MCG/INH AEPB Inhale 1 puff into the lungs daily. (Patient not taking: Reported on 08/22/2016) 1 each 0  .  magic mouthwash SOLN Take 5 mLs by mouth 3 (three) times daily. (Patient not taking: Reported on 08/22/2016) 105 mL 0  . umeclidinium bromide (INCRUSE ELLIPTA) 62.5 MCG/INH AEPB Inhale 1 puff into the lungs daily. (Patient not taking: Reported on 08/22/2016) 30 each 5   No facility-administered medications prior to visit.      Allergies:   Patient has no known allergies.   Social History   Social History  . Marital status: Single    Spouse name: N/A  . Number of children: 0  . Years of  education: [employer]   Occupational History  . unemployed     Was a Geophysical data processor   Social History Main Topics  . Smoking status: Former Smoker    Packs/day: 1.50    Years: 40.00    Types: Cigarettes    Start date: 12/01/1972    Quit date: 03/12/2012  . Smokeless tobacco: Never Used  . Alcohol use 0.6 oz/week    1 Standard drinks or equivalent per week     Comment: socially  . Drug use: No  . Sexual activity: Yes    Partners: Female   Other Topics Concern  . 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   Grew up in [place] and graduated from [employer].   Made living in [place].   Cared for his mother for 10 years and has ignored his own health.   Not seen by a doctor for 16 years.     Family History:  The patient's family history includes Alcohol abuse in his father; Diabetes in his mother; Heart disease in his mother; Hyperlipidemia in his brother and mother; Hypertension in his mother.   ROS:   Please see the history of present illness.    Cough, palpitations, anxiety (especially when active and dyspnea develops).  All other systems reviewed and are negative.   PHYSICAL EXAM:   VS:  BP 124/74   Pulse 76   Ht 6' (1.829 m)   Wt 175 lb 12.8 oz (79.7 kg)   BMI 23.84 kg/m    GEN: Well nourished, well developed, in no acute distress  HEENT: normal  Neck: no JVD, carotid bruits, or masses Cardiac: RRR; no murmurs, rubs, or gallops,no edema  Respiratory:  clear to auscultation bilaterally, normal work of breathing GI: soft, nontender, nondistended, + BS MS: no deformity or atrophy  Skin: warm and dry, no rash Neuro:  Alert and Oriented x 3, Strength and sensation are intact Psych: euthymic mood, full affect  Wt Readings from Last 3 Encounters:  08/22/16 175 lb 12.8 oz (79.7 kg)  07/24/16 177 lb (80.3 kg)  07/18/16 176 lb 9.6 oz (80.1 kg)      Studies/Labs Reviewed:   EKG:  EKG  Not  done.  Recent Labs: 04/27/2016: ALT 18 06/15/2016: NT-Pro BNP 33 07/19/2016: BUN 14; Creatinine, Ser 0.94; Hemoglobin 15.3; Platelets 279; Sodium 140 07/20/2016: Potassium 4.7   Lipid Panel    Component Value Date/Time   CHOL 231 (H) 04/27/2016 0842   TRIG 66 04/27/2016 0842   HDL 72 04/27/2016 0842   LDLCALC 146 (H) 04/27/2016 0842    Additional studies/ records that were reviewed today include:   CARDIAC CATH 07/24/16: Conclusion    90% small first diagonal. The obstructions in the proximal one third of the vessel. Diameter is less than 2 mm.  Widely patent left  main, LAD, and ramus intermedius.  40% proximal to mid circumflex. First obtuse marginal with 50% narrowing.  Dominant right coronary with 30-40% mid vessel narrowing.  Normal LV function. End-diastolic pressure is normal.  Recommendations:   Mild coronary artery disease with most severe obstruction and only critical stenosis occurring in a very small first diagonal that is not amenable to PCI and is unlikely to be the source of symptoms.  Recommend aggressive risk factor modification with LDL cholesterol less than 70, blood pressure less than 140/90 mmHg, antiplatelet therapy, and smoking cessation.  Suspect that the majority of the patient's exertional complaints are related to severe COPD.      ASSESSMENT:    1. Centrilobular emphysema (HCC)   2. Essential hypertension   3. Cigarette smoker   4. Angina pectoris (HCC)      PLAN:  In order of problems listed above:  1. Emphysema requiring bronchodilator therapy and with symptomatic dyspnea on exertion, somewhat improved on current therapy. Being managed by primary care and pulmonology.  2. Excellent control. Reminded of low-salt diet. Blood pressure target less than 140/90 mmHg. With consistent control, intensity of therapy may be reduced over time especially if he begins running systolic pressures less than 115 mmHg. 3. Smoking cessation is discussed  and reinforced. 4. Mild coronary disease with high-grade obstruction and a small diagonal that should be treated with medical therapy only. He does need aggressive risk factor modification including aspirin and statin therapy. I discussed this in detail with the patient. LDL target should be less than 70. Will defer management to primary care.  Clinical follow-up in 9-12 months. Risk factor modification. Blood pressure target as noted above.  Medication Adjustments/Labs and Tests Ordered: Current medicines are reviewed at length with the patient today.  Concerns regarding medicines are outlined above.  Medication changes, Labs and Tests ordered today are listed in the Patient Instructions below. Patient Instructions  Medication Instructions:  None  Labwork: None  Testing/Procedures: None  Follow-Up: Your physician wants you to follow-up in: 9-12 months with Dr. Katrinka Blazing. You will receive a reminder letter in the mail two months in advance. If you don't receive a letter, please call our office to schedule the follow-up appointment.   Any Other Special Instructions Will Be Listed Below (If Applicable).   Low-Sodium Eating Plan Sodium, which is an element that makes up salt, helps you maintain a healthy balance of fluids in your body. Too much sodium can increase your blood pressure and cause fluid and waste to be held in your body. Your health care provider or dietitian may recommend following this plan if you have high blood pressure (hypertension), kidney disease, liver disease, or heart failure. Eating less sodium can help lower your blood pressure, reduce swelling, and protect your heart, liver, and kidneys. What are tips for following this plan? General guidelines  Most people on this plan should limit their sodium intake to 1,500-2,000 mg (milligrams) of sodium each day. Reading food labels  The Nutrition Facts label lists the amount of sodium in one serving of the food. If you eat  more than one serving, you must multiply the listed amount of sodium by the number of servings.  Choose foods with less than 140 mg of sodium per serving.  Avoid foods with 300 mg of sodium or more per serving. Shopping  Look for lower-sodium products, often labeled as "low-sodium" or "no salt added."  Always check the sodium content even if foods are labeled as "unsalted" or "  no salt added".  Buy fresh foods. ? Avoid canned foods and premade or frozen meals. ? Avoid canned, cured, or processed meats  Buy breads that have less than 80 mg of sodium per slice. Cooking  Eat more home-cooked food and less restaurant, buffet, and fast food.  Avoid adding salt when cooking. Use salt-free seasonings or herbs instead of table salt or sea salt. Check with your health care provider or pharmacist before using salt substitutes.  Cook with plant-based oils, such as canola, sunflower, or olive oil. Meal planning  When eating at a restaurant, ask that your food be prepared with less salt or no salt, if possible.  Avoid foods that contain MSG (monosodium glutamate). MSG is sometimes added to [place] food, bouillon, and some canned foods. What foods are recommended? The items listed may not be a complete list. Talk with your dietitian about what dietary choices are best for you. Grains Low-sodium cereals, including oats, puffed wheat and rice, and shredded wheat. Low-sodium crackers. Unsalted rice. Unsalted pasta. Low-sodium bread. Whole-grain breads and whole-grain pasta. Vegetables Fresh or frozen vegetables. "No salt added" canned vegetables. "No salt added" tomato sauce and paste. Low-sodium or reduced-sodium tomato and vegetable juice. Fruits Fresh, frozen, or canned fruit. Fruit juice. Meats and other protein foods Fresh or frozen (no salt added) meat, poultry, seafood, and fish. Low-sodium canned tuna and salmon. Unsalted nuts. Dried peas, beans, and lentils without added salt. Unsalted  canned beans. Eggs. Unsalted nut butters. Dairy Milk. Soy milk. Cheese that is naturally low in sodium, such as ricotta cheese, fresh mozzarella, or Swiss cheese Low-sodium or reduced-sodium cheese. Cream cheese. Yogurt. Fats and oils Unsalted butter. Unsalted margarine with no trans fat. Vegetable oils such as canola or olive oils. Seasonings and other foods Fresh and dried herbs and spices. Salt-free seasonings. Low-sodium mustard and ketchup. Sodium-free salad dressing. Sodium-free light mayonnaise. Fresh or refrigerated horseradish. Lemon juice. Vinegar. Homemade, reduced-sodium, or low-sodium soups. Unsalted popcorn and pretzels. Low-salt or salt-free chips. What foods are not recommended? The items listed may not be a complete list. Talk with your dietitian about what dietary choices are best for you. Grains Instant hot cereals. Bread stuffing, pancake, and biscuit mixes. Croutons. Seasoned rice or pasta mixes. Noodle soup cups. Boxed or frozen macaroni and cheese. Regular salted crackers. Self-rising flour. Vegetables Sauerkraut, pickled vegetables, and relishes. Olives. [place] fries. Onion rings. Regular canned vegetables (not low-sodium or reduced-sodium). Regular canned tomato sauce and paste (not low-sodium or reduced-sodium). Regular tomato and vegetable juice (not low-sodium or reduced-sodium). Frozen vegetables in sauces. Meats and other protein foods Meat or fish that is salted, canned, smoked, spiced, or pickled. Bacon, ham, sausage, hotdogs, corned beef, chipped beef, packaged lunch meats, salt pork, jerky, pickled herring, anchovies, regular canned tuna, sardines, salted nuts. Dairy Processed cheese and cheese spreads. Cheese curds. Blue cheese. Feta cheese. String cheese. Regular cottage cheese. Buttermilk. Canned milk. Fats and oils Salted butter. Regular margarine. Ghee. Bacon fat. Seasonings and other foods Onion salt, garlic salt, seasoned salt, table salt, and sea salt.  Canned and packaged gravies. Worcestershire sauce. Tartar sauce. Barbecue sauce. Teriyaki sauce. Soy sauce, including reduced-sodium. Steak sauce. Fish sauce. Oyster sauce. Cocktail sauce. Horseradish that you find on the shelf. Regular ketchup and mustard. Meat flavorings and tenderizers. Bouillon cubes. Hot sauce and Tabasco sauce. Premade or packaged marinades. Premade or packaged taco seasonings. Relishes. Regular salad dressings. Salsa. Potato and tortilla chips. Corn chips and puffs. Salted popcorn and pretzels. Canned or dried soups.  Pizza. Frozen entrees and pot pies. Summary  Eating less sodium can help lower your blood pressure, reduce swelling, and protect your heart, liver, and kidneys.  Most people on this plan should limit their sodium intake to 1,500-2,000 mg (milligrams) of sodium each day.  Canned, boxed, and frozen foods are high in sodium. Restaurant foods, fast foods, and pizza are also very high in sodium. You also get sodium by adding salt to food.  Try to cook at home, eat more fresh fruits and vegetables, and eat less fast food, canned, processed, or prepared foods. This information is not intended to replace advice given to you by your health care provider. Make sure you discuss any questions you have with your health care provider. Document Released: 08/18/2001 Document Revised: 02/20/2016 Document Reviewed: 02/20/2016 Elsevier Interactive Patient Education  2017 [employer].    If you need a refill on your cardiac medications before your next appointment, please call your pharmacy.      Signed, Lesleigh Noe, MD  08/22/2016 8:40 AM    [facility] [address] [place], [place], [place]  [address] Phone: [phone]; Fax: [phone]

## 2016-08-22 ENCOUNTER — Encounter: Payer: Self-pay | Admitting: Interventional Cardiology

## 2016-08-22 ENCOUNTER — Ambulatory Visit (INDEPENDENT_AMBULATORY_CARE_PROVIDER_SITE_OTHER): Payer: Medicaid Other | Admitting: Interventional Cardiology

## 2016-08-22 VITALS — BP 124/74 | HR 76 | Ht 72.0 in | Wt 175.8 lb

## 2016-08-22 DIAGNOSIS — F17210 Nicotine dependence, cigarettes, uncomplicated: Secondary | ICD-10-CM | POA: Diagnosis not present

## 2016-08-22 DIAGNOSIS — J432 Centrilobular emphysema: Secondary | ICD-10-CM | POA: Diagnosis not present

## 2016-08-22 DIAGNOSIS — I10 Essential (primary) hypertension: Secondary | ICD-10-CM

## 2016-08-22 DIAGNOSIS — I209 Angina pectoris, unspecified: Secondary | ICD-10-CM

## 2016-08-22 MED ORDER — TIOTROPIUM BROMIDE MONOHYDRATE 18 MCG IN CAPS: 18.0000 ug | ORAL_CAPSULE | Freq: Every day | RESPIRATORY_TRACT | 6 refills | Status: DC

## 2016-08-22 NOTE — Patient Instructions (Signed)
Medication Instructions:  None  Labwork: None  Testing/Procedures: None  Follow-Up: Your physician wants you to follow-up in: 9-12 months with Dr. Katrinka Blazing. You will receive a reminder letter in the mail two months in advance. If you don't receive a letter, please call our office to schedule the follow-up appointment.   Any Other Special Instructions Will Be Listed Below (If Applicable).   Low-Sodium Eating Plan Sodium, which is an element that makes up salt, helps you maintain a healthy balance of fluids in your body. Too much sodium can increase your blood pressure and cause fluid and waste to be held in your body. Your health care provider or dietitian may recommend following this plan if you have high blood pressure (hypertension), kidney disease, liver disease, or heart failure. Eating less sodium can help lower your blood pressure, reduce swelling, and protect your heart, liver, and kidneys. What are tips for following this plan? General guidelines  Most people on this plan should limit their sodium intake to 1,500-2,000 mg (milligrams) of sodium each day. Reading food labels  The Nutrition Facts label lists the amount of sodium in one serving of the food. If you eat more than one serving, you must multiply the listed amount of sodium by the number of servings.  Choose foods with less than 140 mg of sodium per serving.  Avoid foods with 300 mg of sodium or more per serving. Shopping  Look for lower-sodium products, often labeled as "low-sodium" or "no salt added."  Always check the sodium content even if foods are labeled as "unsalted" or "no salt added".  Buy fresh foods. ? Avoid canned foods and premade or frozen meals. ? Avoid canned, cured, or processed meats  Buy breads that have less than 80 mg of sodium per slice. Cooking  Eat more home-cooked food and less restaurant, buffet, and fast food.  Avoid adding salt when cooking. Use salt-free seasonings or herbs  instead of table salt or sea salt. Check with your health care provider or pharmacist before using salt substitutes.  Cook with plant-based oils, such as canola, sunflower, or olive oil. Meal planning  When eating at a restaurant, ask that your food be prepared with less salt or no salt, if possible.  Avoid foods that contain MSG (monosodium glutamate). MSG is sometimes added to [place] food, bouillon, and some canned foods. What foods are recommended? The items listed may not be a complete list. Talk with your dietitian about what dietary choices are best for you. Grains Low-sodium cereals, including oats, puffed wheat and rice, and shredded wheat. Low-sodium crackers. Unsalted rice. Unsalted pasta. Low-sodium bread. Whole-grain breads and whole-grain pasta. Vegetables Fresh or frozen vegetables. "No salt added" canned vegetables. "No salt added" tomato sauce and paste. Low-sodium or reduced-sodium tomato and vegetable juice. Fruits Fresh, frozen, or canned fruit. Fruit juice. Meats and other protein foods Fresh or frozen (no salt added) meat, poultry, seafood, and fish. Low-sodium canned tuna and salmon. Unsalted nuts. Dried peas, beans, and lentils without added salt. Unsalted canned beans. Eggs. Unsalted nut butters. Dairy Milk. Soy milk. Cheese that is naturally low in sodium, such as ricotta cheese, fresh mozzarella, or Swiss cheese Low-sodium or reduced-sodium cheese. Cream cheese. Yogurt. Fats and oils Unsalted butter. Unsalted margarine with no trans fat. Vegetable oils such as canola or olive oils. Seasonings and other foods Fresh and dried herbs and spices. Salt-free seasonings. Low-sodium mustard and ketchup. Sodium-free salad dressing. Sodium-free light mayonnaise. Fresh or refrigerated horseradish. Lemon juice. Vinegar. Homemade,  reduced-sodium, or low-sodium soups. Unsalted popcorn and pretzels. Low-salt or salt-free chips. What foods are not recommended? The items listed may  not be a complete list. Talk with your dietitian about what dietary choices are best for you. Grains Instant hot cereals. Bread stuffing, pancake, and biscuit mixes. Croutons. Seasoned rice or pasta mixes. Noodle soup cups. Boxed or frozen macaroni and cheese. Regular salted crackers. Self-rising flour. Vegetables Sauerkraut, pickled vegetables, and relishes. Olives. [place] fries. Onion rings. Regular canned vegetables (not low-sodium or reduced-sodium). Regular canned tomato sauce and paste (not low-sodium or reduced-sodium). Regular tomato and vegetable juice (not low-sodium or reduced-sodium). Frozen vegetables in sauces. Meats and other protein foods Meat or fish that is salted, canned, smoked, spiced, or pickled. Bacon, ham, sausage, hotdogs, corned beef, chipped beef, packaged lunch meats, salt pork, jerky, pickled herring, anchovies, regular canned tuna, sardines, salted nuts. Dairy Processed cheese and cheese spreads. Cheese curds. Blue cheese. Feta cheese. String cheese. Regular cottage cheese. Buttermilk. Canned milk. Fats and oils Salted butter. Regular margarine. Ghee. Bacon fat. Seasonings and other foods Onion salt, garlic salt, seasoned salt, table salt, and sea salt. Canned and packaged gravies. Worcestershire sauce. Tartar sauce. Barbecue sauce. Teriyaki sauce. Soy sauce, including reduced-sodium. Steak sauce. Fish sauce. Oyster sauce. Cocktail sauce. Horseradish that you find on the shelf. Regular ketchup and mustard. Meat flavorings and tenderizers. Bouillon cubes. Hot sauce and Tabasco sauce. Premade or packaged marinades. Premade or packaged taco seasonings. Relishes. Regular salad dressings. Salsa. Potato and tortilla chips. Corn chips and puffs. Salted popcorn and pretzels. Canned or dried soups. Pizza. Frozen entrees and pot pies. Summary  Eating less sodium can help lower your blood pressure, reduce swelling, and protect your heart, liver, and kidneys.  Most people on this  plan should limit their sodium intake to 1,500-2,000 mg (milligrams) of sodium each day.  Canned, boxed, and frozen foods are high in sodium. Restaurant foods, fast foods, and pizza are also very high in sodium. You also get sodium by adding salt to food.  Try to cook at home, eat more fresh fruits and vegetables, and eat less fast food, canned, processed, or prepared foods. This information is not intended to replace advice given to you by your health care provider. Make sure you discuss any questions you have with your health care provider. Document Released: 08/18/2001 Document Revised: 02/20/2016 Document Reviewed: 02/20/2016 Elsevier Interactive Patient Education  2017 [employer]    If you need a refill on your cardiac medications before your next appointment, please call your pharmacy.

## 2016-08-22 NOTE — Telephone Encounter (Signed)
Spoke with the pt and notified of recs per BQ  He verbalized understanding  Rx sent to pharm for Spiriva

## 2016-09-07 ENCOUNTER — Other Ambulatory Visit: Payer: Self-pay | Admitting: Internal Medicine

## 2016-09-07 DIAGNOSIS — F32A Depression, unspecified: Secondary | ICD-10-CM

## 2016-09-07 DIAGNOSIS — F329 Major depressive disorder, single episode, unspecified: Secondary | ICD-10-CM

## 2016-09-07 DIAGNOSIS — F410 Panic disorder [episodic paroxysmal anxiety] without agoraphobia: Secondary | ICD-10-CM

## 2016-09-24 ENCOUNTER — Telehealth: Payer: Self-pay | Admitting: Interventional Cardiology

## 2016-09-24 NOTE — Telephone Encounter (Signed)
New message   Pt c/o swelling: STAT is pt has developed SOB within 24 hours  1. How long have you been experiencing swelling? Since Thursday  2. Where is the swelling located? Legs both  3.  Are you currently taking a "fluid pill"? yes  4.  Are you currently SOB? no  5.  Have you traveled recently? no

## 2016-09-24 NOTE — Telephone Encounter (Signed)
Pt called complaining of swelling  per pt pt's girlfriend pressed on foot and noted some indentation  Swelling was worse on  Saturday less on  Sunday and about the same today.Pt had been eating sweet and salty bars  will eliminate those as well as watch salt intake will forward to Dr Katrinka BlazingSmith for review .Glenn Allison/cy

## 2016-09-25 NOTE — Telephone Encounter (Signed)
Pt aware will cont to monitor ./cy

## 2016-09-25 NOTE — Telephone Encounter (Signed)
Observation is reasonable and cutting back salt in diet. Amlodipine could be playing a role as well. I prefer not to stop amlodipine if at all possible.

## 2016-10-12 ENCOUNTER — Ambulatory Visit (INDEPENDENT_AMBULATORY_CARE_PROVIDER_SITE_OTHER): Payer: Medicaid Other | Admitting: Pulmonary Disease

## 2016-10-12 ENCOUNTER — Encounter: Payer: Self-pay | Admitting: Pulmonary Disease

## 2016-10-12 VITALS — BP 114/66 | HR 73 | Ht 71.0 in | Wt 183.0 lb

## 2016-10-12 DIAGNOSIS — J449 Chronic obstructive pulmonary disease, unspecified: Secondary | ICD-10-CM | POA: Diagnosis not present

## 2016-10-12 DIAGNOSIS — Z23 Encounter for immunization: Secondary | ICD-10-CM

## 2016-10-12 NOTE — Addendum Note (Signed)
Addended by: Velvet BatheAULFIELD, Jalon Blackwelder L on: 10/12/2016 02:10 PM   Modules accepted: Orders

## 2016-10-12 NOTE — Progress Notes (Signed)
Subjective:    Patient ID: Glenn Allison, male    DOB: April 17, 1955, 61 y.o.   MRN: [id number]  Synopsis: First seen in 2018 for severe COPD  HPI Chief Complaint  Patient presents with  . Follow-up    pt states he is feeling much better since initial visit, does note some sob with exertion.  also following up with cardiology.     He is feeling much better.  He says that the new medicines (initially Trelegy, now Advair and Spiriva) are really helping.  He is trying to push thorugh with exercises. He is walking more now, typically about 100 yards at a time.  He says that he he does better pushing himself to walk regularly.  He says that his girlfriend has been pushing him to exercise more.  He has a hard time being sedentary.    Past Medical History:  Diagnosis Date  . COPD (chronic obstructive pulmonary disease) (HCC) 06/01/2016   Greater than 50-pack-year smoking history  . Hyperlipidemia   . Hypertension       Review of Systems  Constitutional: Negative for chills, fatigue and fever.  HENT: Negative for nosebleeds, postnasal drip and rhinorrhea.   Respiratory: Positive for shortness of breath. Negative for choking and wheezing.   Cardiovascular: Negative for chest pain, palpitations and leg swelling.       Objective:   Physical Exam Vitals:   10/12/16 1341  BP: 114/66  Pulse: 73  SpO2: 98%  Weight: 183 lb (83 kg)  Height:  (1.803 m)    Gen: well appearing HENT: OP clear, TM's clear, neck supple PULM: Poor air movement B, normal percussion CV: RRR, no mgr, trace edema GI: BS+, soft, nontender Derm: no cyanosis or rash Psyche: normal mood and affect   Heart Cath: May 2018  90% small first diagonal. The obstructions in the proximal one third of the vessel. Diameter is less than 2 mm.  Widely patent left main, LAD, and ramus intermedius.  40% proximal to mid circumflex. First obtuse marginal with 50% narrowing.  Dominant right coronary with 30-40% mid  vessel narrowing.  Normal LV function. End-diastolic pressure is normal.  PFT: April 2018 ratio 27%, FEV1 0.70 L 20% predicted, FVC 2.62 L 60% predicted, total lung capacity 8.07 L 109% predicted, residual volume 243% predicted, DLCO 13.1 238% predicted  Chest imaging: April April 2018 chest x-ray images independently reviewed showing hyperinflation consistent with emphysema      Assessment & Plan:   Chronic obstructive pulmonary disease, unspecified COPD type (HCC)  Discussion: He has done remarkably well with the addition of a long-acting muscarinic antagonist. He will continue taking Advair and Spiriva. I have encouraged him to exercise more frequently and to practice good hand hygiene in the wintertime. He needs to get a flu shot in the fall. Will give him a Prevnar vaccine today.  Plan: For your COPD: Keep taking Advair and Spiriva Wash your hands frequently one year out in public Exercise regular, gradually increase your exercise routine Get a flu shot when they become available We will see you back in 6 months or sooner if needed    Current Outpatient Prescriptions:  .  albuterol (PROVENTIL HFA;VENTOLIN HFA) 108 (90 Base) MCG/ACT inhaler, Inhale 2 puffs into the lungs every 6 (six) hours as needed for wheezing or shortness of breath., Disp: 1 Inhaler, Rfl: 5 .  amLODipine (NORVASC) 5 MG tablet, Take 1 tablet (5 mg total) by mouth daily., Disp: 30 tablet, Rfl:  11 .  aspirin EC 81 MG tablet, Take 1 tablet (81 mg total) by mouth daily., Disp: , Rfl:  .  citalopram (CELEXA) 20 MG tablet, TAKE 1 TABLET BY MOUTH DAILY, Disp: 30 tablet, Rfl: 10 .  clonazePAM (KLONOPIN) 0.5 MG tablet, Take 1 tablet (0.5 mg total) by mouth 2 (two) times daily as needed for anxiety., Disp: 20 tablet, Rfl: 0 .  DIOVAN 80 MG tablet, Take 1 tablet (80 mg total) by mouth daily., Disp: 90 tablet, Rfl: 3 .  Fluticasone-Salmeterol (ADVAIR DISKUS) 250-50 MCG/DOSE AEPB, Inhale 1 puff into the lungs 2 (two)  times daily., Disp: 60 each, Rfl: 5 .  metoprolol succinate (TOPROL-XL) 100 MG 24 hr tablet, Take 1 tablet (100 mg total) by mouth daily. Take with or immediately following a meal., Disp: 30 tablet, Rfl: 11 .  nitroGLYCERIN (NITROSTAT) 0.4 MG SL tablet, 1 tab sublingual for significant dyspnea, may repeat every 5 minutes x2 if not relieved, Disp: 25 tablet, Rfl: 1 .  nystatin (MYCOSTATIN) 100000 UNIT/ML suspension, Take 5ml 4 times daily for 7 days swish and spit., Disp: 60 mL, Rfl: 0 .  OVER THE COUNTER MEDICATION, Take 1 tablet by mouth at bedtime as needed (sleep). Kavinace, Disp: , Rfl:  .  tiotropium (SPIRIVA) 18 MCG inhalation capsule, Place 1 capsule (18 mcg total) into inhaler and inhale daily., Disp: 30 capsule, Rfl: 6 .  hydrochlorothiazide (MICROZIDE) 12.5 MG capsule, Take 1 capsule (12.5 mg total) by mouth daily., Disp: 90 capsule, Rfl: 3

## 2016-10-12 NOTE — Patient Instructions (Signed)
For your COPD: Keep taking Advair and Spiriva Wash your hands frequently one year out in public Exercise regular, gradually increase your exercise routine Get a flu shot when they become available We will see you back in 6 months or sooner if needed

## 2016-10-18 ENCOUNTER — Telehealth: Payer: Self-pay | Admitting: Interventional Cardiology

## 2016-10-18 NOTE — Telephone Encounter (Signed)
Okay to stop amlodipine permanently. List edema as a side effect/intolerance so we don't use again in future. Monitor BP closely. We will increase Diovan intensity if needed to keep BP < 140/90 mmHg.

## 2016-10-18 NOTE — Telephone Encounter (Signed)
Spoke with pt and went over recommendations per Dr. Smith.  Pt verbalized understanding and was in agreement with this plan.   

## 2016-10-18 NOTE — Telephone Encounter (Signed)
Pt states still having some issues with ankle swelling.  It has improved some since last OV but it still occurring.  SOB has improved since last OV and pt is now able to walk 100yds or so with no issues.  BP at Dr. Ulyses JarredMcQuaid's office on 8/3 was 114/66.  Pt currently taking HCTZ 12.5mg  QD.  Pt stopped Amlodipine x 2 days and states swelling resolved and he had no issues.  Started Amlodipine back and swelling returned.  Advised I would send message to Dr. Katrinka BlazingSmith for review and advisement.

## 2016-10-18 NOTE — Telephone Encounter (Signed)
New Message    Pt c/o swelling: STAT is pt has developed SOB within 24 hours  1. How long have you been experiencing swelling? Off and on for about 2 or 3 weeks  2. Where is the swelling located? It is located in the patients in ankles legs and feet  3.  Are you currently taking a "fluid pill"? no  4.  Are you currently SOB? no  5.  Have you traveled recently? no

## 2016-11-06 ENCOUNTER — Ambulatory Visit: Payer: Self-pay | Admitting: Pulmonary Disease

## 2016-11-06 ENCOUNTER — Ambulatory Visit: Payer: Self-pay

## 2016-11-30 ENCOUNTER — Telehealth: Payer: Self-pay | Admitting: Acute Care

## 2016-11-30 NOTE — Telephone Encounter (Signed)
I attempted to call Mr. Glenn Allison with the results of his low-dose screening CT. There was no answer. I left a detailed message with our contact information requesting that the patient call the office for his results. If we don't hear from the patient I will call again on Monday.

## 2016-12-03 ENCOUNTER — Telehealth: Payer: Self-pay | Admitting: Acute Care

## 2016-12-03 DIAGNOSIS — Z122 Encounter for screening for malignant neoplasm of respiratory organs: Secondary | ICD-10-CM

## 2016-12-03 DIAGNOSIS — Z87891 Personal history of nicotine dependence: Secondary | ICD-10-CM

## 2016-12-03 NOTE — Telephone Encounter (Signed)
I have spoken with Glenn Allison. I explained that his scan was read as a Lung  RADS 3, nodules that are probably benign findings, short term follow up suggested: includes nodules with a low likelihood of becoming a clinically active cancer. Radiology recommends a 6 month repeat LDCT follow up.He verbalized understanding of these results. I explained that we will schedule him for a 6 month follow up scan. Angelique Blonder, please schedule 6 month follow up at [facility] through the scholarship program. Please fax results to the patient's PCP. Thanks so much.

## 2016-12-03 NOTE — Telephone Encounter (Signed)
Results faxed to PCP.  6 mth f/u CT scan ordered through lung cancer screening grant through [facility].

## 2016-12-03 NOTE — Telephone Encounter (Signed)
Glenn Allison, pt called back for CT results.

## 2017-01-29 ENCOUNTER — Ambulatory Visit: Payer: Self-pay | Admitting: Internal Medicine

## 2017-02-11 ENCOUNTER — Other Ambulatory Visit: Payer: Self-pay

## 2017-02-11 MED ORDER — VALSARTAN 80 MG PO TABS: 80.0000 mg | ORAL_TABLET | Freq: Every day | ORAL | 1 refills | Status: DC

## 2017-02-21 ENCOUNTER — Telehealth: Payer: Self-pay | Admitting: Interventional Cardiology

## 2017-02-21 NOTE — Telephone Encounter (Signed)
[facility].  LDL now 210, was 146 on 04/27/16.  Total Cholesterol now 287, was 231.  Pt states he hasn't changed much in his diet.  Pt did research on a new medication that he started called Rexulti and he found where it can drive cholesterol up.  Pt not currently on a statin medication but is concerned about LDL being this high.  Pt has appt on Monday with Nada BoozerLaura Ingold, NP and will bring a copy of his labs to that appt.  Advised pt to speak with Vernona RiegerLaura about this and they can discuss options for LDL.  Pt verbalized understanding and was appreciative for call.  Will route to Dr. Katrinka BlazingSmith to make him aware.

## 2017-02-21 NOTE — Telephone Encounter (Signed)
Left message to call back  

## 2017-02-21 NOTE — Telephone Encounter (Signed)
New message     Patient is really concerned with his LDL number, it has doubled please call

## 2017-02-22 ENCOUNTER — Encounter: Payer: Self-pay | Admitting: Cardiology

## 2017-02-22 NOTE — Progress Notes (Signed)
Cardiology Office Note   Date:  02/25/2017   ID:  Glenn Allison, DOB [birth year], MRN [id number]  PCP:  Heber Casey, PA-C  Cardiologist:  Dr. Tamala Julian    Chief Complaint  Patient presents with  . Hyperlipidemia      History of Present Illness: Glenn Allison is a 61 y.o. male who presents for HTN, Hyperlipidemia and has been seen by Dr Tamala Julian for chest pressure. He has had cardiac cath with minimal CAD in and normal LV function.  + tobacco. Mild coronary disease with high-grade obstruction and a small diagonal that should be treated with medical therapy only. He does need aggressive risk factor modification including aspirin and statin therapy. I discussed this in detail with the patient. LDL target should be less than 70  Now with elevated chol. With LDL of 210.  HDL 49   Discussed diet and he is trying to adjust and decrease fried foods and eat more vegetables.  But his goal is 70 for LDL.  we will add statin.  He still has SOB and wife would like to arrange home oxygen.  I asked them to address with pulmonary but that requirements needed to be met.  They see Dr. Lake Bells in near future.  The hope is with oxygen maybe he could do more physically without being SOB.  He is slowly adapting to this as well.      When he exerts himself he does develop some chest pressure and takes a NTG with improvement.  Possible his BP increases and this helps to bring back down.    Past Medical History:  Diagnosis Date  . COPD (chronic obstructive pulmonary disease) ([place]) 06/01/2016   Greater than 50-pack-year smoking history  . Hyperlipidemia   . Hypertension     Past Surgical History:  Procedure Laterality Date  . LEFT HEART CATH AND CORONARY ANGIOGRAPHY N/A 07/24/2016   Procedure: Left Heart Cath and Coronary Angiography;  Surgeon: Belva Crome, MD;  Location: [place] [facility];  Service: Cardiovascular;  Laterality: N/A;  . None       Current Outpatient Medications  Medication Sig  Dispense Refill  . albuterol (PROVENTIL HFA;VENTOLIN HFA) 108 (90 Base) MCG/ACT inhaler Inhale 2 puffs into the lungs every 6 (six) hours as needed for wheezing or shortness of breath. 1 Inhaler 5  . aspirin EC 81 MG tablet Take 1 tablet (81 mg total) by mouth daily.    . citalopram (CELEXA) 20 MG tablet TAKE 1 TABLET BY MOUTH DAILY 30 tablet 10  . clonazePAM (KLONOPIN) 0.5 MG tablet Take 1 tablet (0.5 mg total) by mouth 2 (two) times daily as needed for anxiety. 20 tablet 0  . Fluticasone-Salmeterol (ADVAIR DISKUS) 250-50 MCG/DOSE AEPB Inhale 1 puff into the lungs 2 (two) times daily. 60 each 5  . metoprolol succinate (TOPROL-XL) 100 MG 24 hr tablet Take 1 tablet (100 mg total) by mouth daily. Take with or immediately following a meal. 30 tablet 11  . nitroGLYCERIN (NITROSTAT) 0.4 MG SL tablet 1 tab sublingual for significant dyspnea, may repeat every 5 minutes x2 if not relieved 25 tablet 1  . nystatin (MYCOSTATIN) 100000 UNIT/ML suspension Take 64m 4 times daily for 7 days swish and spit. 60 mL 0  . REXULTI 1 MG TABS Take 1 tablet by mouth every morning.  3  . tiotropium (SPIRIVA) 18 MCG inhalation capsule Place 1 capsule (18 mcg total) into inhaler and inhale daily. 30 capsule 6  . valsartan (DIOVAN)  80 MG tablet Take 1 tablet (80 mg total) by mouth daily. 90 tablet 1  . atorvastatin (LIPITOR) 40 MG tablet Take 1 tablet (40 mg total) by mouth daily. 90 tablet 3  . hydrochlorothiazide (MICROZIDE) 12.5 MG capsule Take 1 capsule (12.5 mg total) by mouth daily. 90 capsule 3   No current facility-administered medications for this visit.     Allergies:   Patient has no known allergies.    Social History:  The patient  reports that he quit smoking about 4 years ago. His smoking use included cigarettes. He started smoking about 44 years ago. He has a 60.00 pack-year smoking history. he has never used smokeless tobacco. He reports that he drinks about 0.6 oz of alcohol per week. He reports that he  does not use drugs.   Family History:  The patient's family history includes Alcohol abuse in his father; Diabetes in his mother; Heart disease in his mother; Hyperlipidemia in his brother and mother; Hypertension in his mother.    ROS:  General:no colds or fevers, no weight changes Skin:no rashes or ulcers HEENT:no blurred vision, no congestion CV:see HPI PUL:see HPI GI:no diarrhea constipation or melena, no indigestion GU:no hematuria, no dysuria MS:no joint pain, no claudication Neuro:no syncope, no lightheadedness Endo:no diabetes, no thyroid disease   Wt Readings from Last 3 Encounters:  02/25/17 186 lb 6.4 oz (84.6 kg)  10/12/16 183 lb (83 kg)  08/22/16 175 lb 12.8 oz (79.7 kg)     PHYSICAL EXAM: VS:  BP 112/62   Pulse 64   Resp 16   Ht _0  (1.803 m)   Wt 186 lb 6.4 oz (84.6 kg)   SpO2 96%   BMI 26.00 kg/m  , BMI Body mass index is 26 kg/m. General:Pleasant affect, NAD Skin:Warm and dry, brisk capillary refill HEENT:normocephalic, sclera clear, mucus membranes moist Neck:supple, no JVD, no bruits  Heart:S1S2 RRR without murmur, gallup, rub or click Lungs:clear without rales, rhonchi, or wheezes FAO:ZHYQ, non tender, + BS, do not palpate liver spleen or masses Ext:no lower ext edema, 2+ pedal pulses, 2+ radial pulses Neuro:alert and oriented X 3, MAE, follows commands, + facial symmetry    EKG:  EKG is NOT ordered today.    Recent Labs: 04/27/2016: ALT 18 06/15/2016: NT-Pro BNP 33 07/19/2016: BUN 14; Creatinine, Ser 0.94; Hemoglobin 15.3; Platelets 279; Sodium 140 07/20/2016: Potassium 4.7    Lipid Panel    Component Value Date/Time   CHOL 231 (H) 04/27/2016 0842   TRIG 66 04/27/2016 0842   HDL 72 04/27/2016 0842   LDLCALC 146 (H) 04/27/2016 6578       Other studies Reviewed: Additional studies/ records that were reviewed today include:  cardiac cath 07/24/16 . Procedures   Left Heart Cath and Coronary Angiography  Conclusion    90% small  first diagonal. The obstructions in the proximal one third of the vessel. Diameter is less than 2 mm.  Widely patent left main, LAD, and ramus intermedius.  40% proximal to mid circumflex. First obtuse marginal with 50% narrowing.  Dominant right coronary with 30-40% mid vessel narrowing.  Normal LV function. End-diastolic pressure is normal.  Recommendations:   Mild coronary artery disease with most severe obstruction and only critical stenosis occurring in a very small first diagonal that is not amenable to PCI and is unlikely to be the source of symptoms.  Recommend aggressive risk factor modification with LDL cholesterol less than 70, blood pressure less than 140/90 mmHg, antiplatelet therapy, and  smoking cessation.  Suspect that the majority of the patient's exertional complaints are related to severe COPD.     ASSESSMENT AND PLAN:  1.  occ chest pain with his coronaries stable reassured he was not having a heart attack but could be elevated BP and taking NTG was fine.  NTG was refilled.  Follow up with Dr. Tamala Julian in 4 months  2. Non obstructive CAD stable  3.  HLD with LDL at 210 adding lipitor 40 mg daily and recheck hepatic and lipid in 8 weeks.   4.  COPD followed by pulmonary and only 20% of lung function present.  They will discuss home oxygen with pulmonary.   5.  essential HTN controlled   Current medicines are reviewed with the patient today.  The patient Has no concerns regarding medicines.  The following changes have been made:  See above Labs/ tests ordered today include:see above  Disposition:   FU:  see above  Signed, Cecilie Kicks, NP  02/25/2017 5:55 PM    [address] [place], [place], [place] Phone: [phone]; Fax: [phone]

## 2017-02-23 NOTE — Telephone Encounter (Signed)
Needs to start statin unless there is contraindication. He has non obstructive CAD. Don't want it to progress.

## 2017-02-25 ENCOUNTER — Encounter: Payer: Self-pay | Admitting: Cardiology

## 2017-02-25 ENCOUNTER — Ambulatory Visit: Payer: Medicaid Other | Admitting: Cardiology

## 2017-02-25 VITALS — BP 112/62 | HR 64 | Resp 16 | Ht 71.0 in | Wt 186.4 lb

## 2017-02-25 DIAGNOSIS — E782 Mixed hyperlipidemia: Secondary | ICD-10-CM | POA: Diagnosis not present

## 2017-02-25 DIAGNOSIS — I2510 Atherosclerotic heart disease of native coronary artery without angina pectoris: Secondary | ICD-10-CM

## 2017-02-25 DIAGNOSIS — I10 Essential (primary) hypertension: Secondary | ICD-10-CM

## 2017-02-25 DIAGNOSIS — R0789 Other chest pain: Secondary | ICD-10-CM

## 2017-02-25 DIAGNOSIS — J449 Chronic obstructive pulmonary disease, unspecified: Secondary | ICD-10-CM | POA: Diagnosis not present

## 2017-02-25 MED ORDER — ATORVASTATIN CALCIUM 40 MG PO TABS: 40.0000 mg | ORAL_TABLET | Freq: Every day | ORAL | 3 refills | Status: DC

## 2017-02-25 MED ORDER — NITROGLYCERIN 0.4 MG SL SUBL: SUBLINGUAL_TABLET | SUBLINGUAL | 1 refills | Status: DC

## 2017-02-25 NOTE — Patient Instructions (Addendum)
Your physician has recommended you make the following change in your medication:  ATORVASTATIN 40 MG  EVERY EVENING   Your physician recommends that you return for lab work in: FASTING LIPID AND LIVER 8 WEEKS  MID  [place]  Your physician wants you to follow-up in: 4 MONTHS WITH DR Marlou StarksSMITH   You will receive a reminder letter in the mail two months in advance. If you don't receive a letter, please call our office to schedule the follow-up appointment.

## 2017-02-25 NOTE — Telephone Encounter (Signed)
Pt needs lipitor 40 mg daily.  Recheck lipids in 6 weeks and hepatic panel

## 2017-03-01 ENCOUNTER — Ambulatory Visit (INDEPENDENT_AMBULATORY_CARE_PROVIDER_SITE_OTHER): Payer: Medicaid Other | Admitting: *Deleted

## 2017-03-01 ENCOUNTER — Ambulatory Visit: Payer: Medicaid Other | Admitting: Pulmonary Disease

## 2017-03-01 ENCOUNTER — Encounter: Payer: Self-pay | Admitting: Pulmonary Disease

## 2017-03-01 VITALS — BP 120/74 | HR 67 | Ht 71.0 in | Wt 187.6 lb

## 2017-03-01 DIAGNOSIS — R0609 Other forms of dyspnea: Secondary | ICD-10-CM | POA: Diagnosis not present

## 2017-03-01 DIAGNOSIS — J449 Chronic obstructive pulmonary disease, unspecified: Secondary | ICD-10-CM

## 2017-03-01 DIAGNOSIS — B370 Candidal stomatitis: Secondary | ICD-10-CM | POA: Diagnosis not present

## 2017-03-01 MED ORDER — DOXYCYCLINE HYCLATE 100 MG PO TABS: 100.0000 mg | ORAL_TABLET | Freq: Two times a day (BID) | ORAL | 0 refills | Status: DC

## 2017-03-01 MED ORDER — FLUTICASONE-UMECLIDIN-VILANT 100-62.5-25 MCG/INH IN AEPB: 1.0000 | INHALATION_SPRAY | Freq: Every day | RESPIRATORY_TRACT | 0 refills | Status: AC

## 2017-03-01 MED ORDER — ALBUTEROL SULFATE HFA 108 (90 BASE) MCG/ACT IN AERS: 2.0000 | INHALATION_SPRAY | Freq: Four times a day (QID) | RESPIRATORY_TRACT | 5 refills | Status: DC | PRN

## 2017-03-01 MED ORDER — ALBUTEROL SULFATE (2.5 MG/3ML) 0.083% IN NEBU: 2.5000 mg | INHALATION_SOLUTION | Freq: Four times a day (QID) | RESPIRATORY_TRACT | 12 refills | Status: DC | PRN

## 2017-03-01 MED ORDER — PREDNISONE 20 MG PO TABS: 20.0000 mg | ORAL_TABLET | Freq: Every day | ORAL | 0 refills | Status: DC

## 2017-03-01 MED ORDER — FLUTICASONE-UMECLIDIN-VILANT 100-62.5-25 MCG/INH IN AEPB: 1.0000 | INHALATION_SPRAY | Freq: Every day | RESPIRATORY_TRACT | 6 refills | Status: DC

## 2017-03-01 NOTE — Patient Instructions (Addendum)
Former cigarette smoker: Continue annual lung cancer screening CT scans  Shortness of breath: We will check a 6-minute walk test today to see if you need oxygen while exerting herself I believe that a big part of this is due to deconditioning so we are going to refer you to a local physical therapist to help improve your overall physical fitness  COPD, very severe: Stop Advair and Spiriva due to recurrent thrush requiring treatment Start Trelegy 1 puff daily Use albuterol as needed for chest tightness wheezing or shortness of breath: We will give you an HFA inhaler and nebulizer prescription today, these can be used interchangeably  Acute sinusitis with mild COPD exacerbation: Take the doxycycline 100 mg twice a day as prescribed If you have increasing chest congestion shortness of breath or wheezing take the prednisone  We will see you back in 6-8 weeks or sooner if needed

## 2017-03-01 NOTE — Progress Notes (Signed)
Patient here for a 6 minute walk but after 2 laps he requested to stop, oxygen level at 88%. I walked him on 2L and after the second lap he desat to 88% so I placed him on 3L. He stayed at 93% on 3L on the third lap. TA/CMA

## 2017-03-01 NOTE — Progress Notes (Signed)
Subjective:    Patient ID: Glenn Allison, male    DOB: Jan 27, 1956, 61 y.o.   MRN: [id number]  Synopsis: First seen in 2018 for severe COPD  HPI Chief Complaint  Patient presents with  . Follow-up    93mo rov- pt feels that breathing has slightly worsen due to cold weather. pt reports of increased sob, postnasal drip & prod cough with greenish to yellow mucus   Glenn Allison feels like his breathing is a little worse this time of year.  He says that the cold makes him feel more short of breath.  He recently couldn't clean off the car.  He has been walking frequently and tries to walk as much as he can.  He says taht climbing a hill makes him quite short of breath.  He would like to know if he could use portable oxygen.  Apparently he has been having some cholesterol problems and he started on a lipitor recently and they have adjusted his psyche medicines.    Apparently his oxygen level was low when he went to cardiology.    He has not had bronchitis or pneumonia but in the last few days he has some green to yellow mucus that he is coughing up.  He feels like it is drainage from his sinuses.  He feels slightly more chest tightness and wheezing.    Exercise: he tries to walk 5 days a week either inside or outside.    He's had thrush a few times, would like to know if he can consolidate his medication regimen. He's needed Nystatin several times for this.   Past Medical History:  Diagnosis Date  . COPD (chronic obstructive pulmonary disease) (HCC) 06/01/2016   Greater than 50-pack-year smoking history  . Hyperlipidemia   . Hypertension       Review of Systems  Constitutional: Negative for chills, fatigue and fever.  HENT: Negative for nosebleeds, postnasal drip and rhinorrhea.   Respiratory: Positive for shortness of breath. Negative for choking and wheezing.   Cardiovascular: Negative for chest pain, palpitations and leg swelling.       Objective:   Physical Exam Vitals:   03/01/17  1039  BP: 120/74  Pulse: 67  SpO2: 100%  Weight: 187 lb 9.6 oz (85.1 kg)  Height: 5\' 11"  (1.803 m)   RA  Gen: well appearing HENT: OP clear, TM's clear, neck supple PULM: Poor airflow, no wheezing, normal percussion CV: RRR, no mgr, trace edema GI: BS+, soft, nontender Derm: no cyanosis or rash Psyche: normal mood and affect    Heart Cath: May 2018  90% small first diagonal. The obstructions in the proximal one third of the vessel. Diameter is less than 2 mm.  Widely patent left main, LAD, and ramus intermedius.  40% proximal to mid circumflex. First obtuse marginal with 50% narrowing.  Dominant right coronary with 30-40% mid vessel narrowing.  Normal LV function. End-diastolic pressure is normal.  PFT: April 2018 ratio 27%, FEV1 0.70 L 20% predicted, FVC 2.62 L 60% predicted, total lung capacity 8.07 L 109% predicted, residual volume 243% predicted, DLCO 13.1 238% predicted  Chest imaging: April April 2018 chest x-ray images independently reviewed showing hyperinflation consistent with emphysema September 2018 lung cancer screening CT scan showed emphysema, RADS 3      Assessment & Plan:   Oral thrush  Chronic obstructive pulmonary disease, unspecified COPD type (HCC) - Plan: albuterol (PROVENTIL HFA;VENTOLIN HFA) 108 (90 Base) MCG/ACT inhaler, Ambulatory referral to Physical Therapy, Ambulatory  Referral for DME  Dyspnea on exertion  Discussion: Glenn Allison is frustrated with his lack of progress.  He says he feels quite short of breath with any exertion and feels like it is really not getting better.  He has been trying to exercise 5 days a week with regular walking but he can only go so far.  He also has been experiencing recurrent thrush.  I believe this is due to his current inhaler regimen.  He is wondering if he would qualify for oxygen.  Overall he is physically deconditioned and has very severe airflow obstruction.  At some point he may be a lung transplant  candidate, but he would need to have better management of his anxiety and mental health issues and exercise more frequently.  Plan: Former cigarette smoker: Continue annual lung cancer screening CT scans  Shortness of breath: We will check a 6-minute walk test today to see if you need oxygen while exerting herself I believe that a big part of this is due to deconditioning so we are going to refer you to a local physical therapist to help improve your overall physical fitness  COPD, very severe: Stop Advair and Spiriva due to recurrent thrush requiring treatment Start Trelegy 1 puff daily Use albuterol as needed for chest tightness wheezing or shortness of breath: We will give you an HFA inhaler and nebulizer prescription today, these can be used interchangeably  Acute sinusitis with mild COPD exacerbation: Take the doxycycline 100 mg twice a day as prescribed If you have increasing chest congestion shortness of breath or wheezing take the prednisone  We will see you back in 6-8 weeks or sooner if needed    Current Outpatient Medications:  .  albuterol (PROVENTIL HFA;VENTOLIN HFA) 108 (90 Base) MCG/ACT inhaler, Inhale 2 puffs into the lungs every 6 (six) hours as needed for wheezing or shortness of breath., Disp: 1 Inhaler, Rfl: 5 .  aspirin EC 81 MG tablet, Take 1 tablet (81 mg total) by mouth daily., Disp: , Rfl:  .  atorvastatin (LIPITOR) 40 MG tablet, Take 1 tablet (40 mg total) by mouth daily., Disp: 90 tablet, Rfl: 3 .  Brexpiprazole (REXULTI) 1 MG TABS, Take by mouth. 0.5 tablet daily, Disp: , Rfl:  .  citalopram (CELEXA) 20 MG tablet, TAKE 1 TABLET BY MOUTH DAILY (Patient taking differently: TAKE 0.5 TABLET BY MOUTH DAILY), Disp: 30 tablet, Rfl: 10 .  clonazePAM (KLONOPIN) 0.5 MG tablet, Take 1 tablet (0.5 mg total) by mouth 2 (two) times daily as needed for anxiety., Disp: 20 tablet, Rfl: 0 .  Fluticasone-Salmeterol (ADVAIR DISKUS) 250-50 MCG/DOSE AEPB, Inhale 1 puff into the  lungs 2 (two) times daily., Disp: 60 each, Rfl: 5 .  metoprolol succinate (TOPROL-XL) 100 MG 24 hr tablet, Take 1 tablet (100 mg total) by mouth daily. Take with or immediately following a meal., Disp: 30 tablet, Rfl: 11 .  nitroGLYCERIN (NITROSTAT) 0.4 MG SL tablet, 1 tab sublingual for significant dyspnea, may repeat every 5 minutes x2 if not relieved, Disp: 25 tablet, Rfl: 1 .  nystatin (MYCOSTATIN) 100000 UNIT/ML suspension, Take 5ml 4 times daily for 7 days swish and spit., Disp: 60 mL, Rfl: 0 .  REXULTI 1 MG TABS, Take 1 tablet by mouth every morning., Disp: , Rfl: 3 .  valsartan (DIOVAN) 80 MG tablet, Take 1 tablet (80 mg total) by mouth daily., Disp: 90 tablet, Rfl: 1 .  hydrochlorothiazide (MICROZIDE) 12.5 MG capsule, Take 1 capsule (12.5 mg total) by mouth daily.,  Disp: 90 capsule, Rfl: 3 .  tiotropium (SPIRIVA) 18 MCG inhalation capsule, Place 1 capsule (18 mcg total) into inhaler and inhale daily. (Patient not taking: Reported on 03/01/2017), Disp: 30 capsule, Rfl: 6

## 2017-03-11 ENCOUNTER — Telehealth: Payer: Self-pay | Admitting: Pulmonary Disease

## 2017-03-11 DIAGNOSIS — J9610 Chronic respiratory failure, unspecified whether with hypoxia or hypercapnia: Secondary | ICD-10-CM

## 2017-03-11 NOTE — Telephone Encounter (Signed)
Spoke with patient Advised patient that the 6 min walk has not been reviewed with BQ at this time. Order for O2 was not placed, as we are needing to know what liter flow per BQ.  Pt stated that his new rescue inhaler is not helping him with SOB with exertion. Pt okay with call back on Wed 03/13/2017

## 2017-03-11 NOTE — Telephone Encounter (Signed)
Needs order for 3 L O2 with exertion

## 2017-03-13 DIAGNOSIS — J9610 Chronic respiratory failure, unspecified whether with hypoxia or hypercapnia: Secondary | ICD-10-CM | POA: Insufficient documentation

## 2017-03-13 NOTE — Telephone Encounter (Signed)
Order was placed for o2  Spoke with the pt and notified that this was done  Nothing further needed

## 2017-03-13 NOTE — Telephone Encounter (Signed)
Pt is calling back [phone]

## 2017-03-13 NOTE — Telephone Encounter (Signed)
Left message for pt to call back x1 

## 2017-04-10 ENCOUNTER — Ambulatory Visit: Payer: Self-pay | Admitting: Pulmonary Disease

## 2017-04-26 ENCOUNTER — Other Ambulatory Visit: Payer: Self-pay | Admitting: Interventional Cardiology

## 2017-04-26 DIAGNOSIS — I10 Essential (primary) hypertension: Secondary | ICD-10-CM

## 2017-04-29 ENCOUNTER — Other Ambulatory Visit: Payer: Medicaid Other | Admitting: *Deleted

## 2017-04-29 ENCOUNTER — Telehealth: Payer: Self-pay | Admitting: *Deleted

## 2017-04-29 DIAGNOSIS — E782 Mixed hyperlipidemia: Secondary | ICD-10-CM

## 2017-04-29 DIAGNOSIS — Z79899 Other long term (current) drug therapy: Secondary | ICD-10-CM

## 2017-04-29 LAB — HEPATIC FUNCTION PANEL
ALK PHOS: 96 IU/L (ref 39–117)
ALT: 27 IU/L (ref 0–44)
AST: 21 IU/L (ref 0–40)
Albumin: 4.7 g/dL (ref 3.6–4.8)
BILIRUBIN, DIRECT: 0.23 mg/dL (ref 0.00–0.40)
Bilirubin Total: 1 mg/dL (ref 0.0–1.2)
TOTAL PROTEIN: 7.4 g/dL (ref 6.0–8.5)

## 2017-04-29 LAB — LIPID PANEL
CHOLESTEROL TOTAL: 152 mg/dL (ref 100–199)
Chol/HDL Ratio: 2.9 ratio (ref 0.0–5.0)
HDL: 52 mg/dL (ref >39)
LDL Calculated: 87 mg/dL (ref 0–99)
Triglycerides: 64 mg/dL (ref 0–149)
VLDL Cholesterol Cal: 13 mg/dL (ref 5–40)

## 2017-04-29 MED ORDER — EZETIMIBE 10 MG PO TABS: 10.0000 mg | ORAL_TABLET | Freq: Every day | ORAL | 3 refills | Status: DC

## 2017-04-29 NOTE — Telephone Encounter (Signed)
Dr Katrinka BlazingSmith has never filled this for the patient, ordering provider has [facility]. Okay to refill? Please advise. Thanks, MI

## 2017-04-29 NOTE — Telephone Encounter (Signed)
-----   Message from Leone BrandLaura R Ingold, NP sent at 04/29/2017  3:48 PM EST ----- To improved LDL to < 70 lets add zetia 10 mg daily.  Recheck hepatic and lipid in 8 weeks.

## 2017-04-30 NOTE — Telephone Encounter (Signed)
Would defer to original prescriber since they have always filled it.

## 2017-05-03 ENCOUNTER — Telehealth: Payer: Self-pay | Admitting: Interventional Cardiology

## 2017-05-03 NOTE — Telephone Encounter (Signed)
New Message     Patient wants to know why he was put on  ezetimibe (ZETIA) 10 MG tablet Take 1 tablet (10 mg total) by mouth daily.   Please call , not having medication issue, just wanting to know why he was put on it

## 2017-05-03 NOTE — Telephone Encounter (Signed)
Spoke with pt and he said his wife wanted to know why he was started on Zetia so he placed me on speaker phone.  Explained pt's LDL is 87 and needed to be 70 or lower.  Both verbalized understanding and were appreciative for call.

## 2017-05-06 ENCOUNTER — Ambulatory Visit: Payer: Medicaid Other | Admitting: Pulmonary Disease

## 2017-05-06 ENCOUNTER — Encounter: Payer: Self-pay | Admitting: Pulmonary Disease

## 2017-05-06 VITALS — BP 122/84 | HR 89 | Ht 71.0 in | Wt 184.4 lb

## 2017-05-06 DIAGNOSIS — Z87891 Personal history of nicotine dependence: Secondary | ICD-10-CM

## 2017-05-06 DIAGNOSIS — J9610 Chronic respiratory failure, unspecified whether with hypoxia or hypercapnia: Secondary | ICD-10-CM

## 2017-05-06 DIAGNOSIS — B370 Candidal stomatitis: Secondary | ICD-10-CM | POA: Diagnosis not present

## 2017-05-06 DIAGNOSIS — J432 Centrilobular emphysema: Secondary | ICD-10-CM

## 2017-05-06 MED ORDER — ALBUTEROL SULFATE HFA 108 (90 BASE) MCG/ACT IN AERS: 1.0000 | INHALATION_SPRAY | Freq: Four times a day (QID) | RESPIRATORY_TRACT | 5 refills | Status: DC | PRN

## 2017-05-06 MED ORDER — NYSTATIN 100000 UNIT/ML MT SUSP: OROMUCOSAL | 0 refills | Status: DC

## 2017-05-06 NOTE — Patient Instructions (Signed)
History of cigarette smoking: I have sent a message to our lung cancer screening coordinator to make sure that she have another CT in March  Centrilobular emphysema with COPD: Continue Trelegy daily Continue to exercise daily We will change your albuterol to pro-air  Chronic respiratory failure with hypoxemia: Continue using 2 L of oxygen with exertion We will arrange for a portable oxygen concentrator  We will see you back in 4-6 months or sooner if needed

## 2017-05-06 NOTE — Progress Notes (Signed)
Subjective:    Patient ID: Glenn Allison, male    DOB: Nov 01, 1955, 62 y.o.   MRN: [id number]  Synopsis: First seen in 2018 for severe COPD  HPI Chief Complaint  Patient presents with  . Follow-up    pt has good and bad days, notes increased SOB worse with cold/rainy weather.     Glenn Allison says he is good. He still has some trouble from time to time.   He says that he is using and benefitting form the oxygen routine.  The tanks are heavy though.  He can walk farther but he can't get as far.  Medicines: he wants to go back the old rescue inhaler because the currently one gives him more anxiety than the proAIr.  He wants to make sure he has a repeat CT chest.   He says that anxiety and weather make his symptoms worse.  He is taking anxiety medicines now and then. He is walking more and feels well. Has thrush from time to time.   Past Medical History:  Diagnosis Date  . COPD (chronic obstructive pulmonary disease) (HCC) 06/01/2016   Greater than 50-pack-year smoking history  . Hyperlipidemia   . Hypertension       Review of Systems  Constitutional: Negative for chills, fatigue and fever.  HENT: Negative for nosebleeds, postnasal drip and rhinorrhea.   Respiratory: Positive for shortness of breath. Negative for choking and wheezing.   Cardiovascular: Negative for chest pain, palpitations and leg swelling.       Objective:   Physical Exam Vitals:   05/06/17 1032  BP: 122/84  Pulse: 89  SpO2: 96%  Weight: 184 lb 6.4 oz (83.6 kg)  Height: 5\' 11"  (1.803 m)   RA  Gen: well appearing HENT: OP clear, TM's clear, neck supple PULM: Dimished B, normal percussion CV: RRR, no mgr, trace edema GI: BS+, soft, nontender Derm: no cyanosis or rash Psyche: normal mood and affect   Heart Cath: May 2018  90% small first diagonal. The obstructions in the proximal one third of the vessel. Diameter is less than 2 mm.  Widely patent left main, LAD, and ramus  intermedius.  40% proximal to mid circumflex. First obtuse marginal with 50% narrowing.  Dominant right coronary with 30-40% mid vessel narrowing.  Normal LV function. End-diastolic pressure is normal.  PFT: April 2018 ratio 27%, FEV1 0.70 L 20% predicted, FVC 2.62 L 60% predicted, total lung capacity 8.07 L 109% predicted, residual volume 243% predicted, DLCO 13.1 238% predicted  Chest imaging: April April 2018 chest x-ray images independently reviewed showing hyperinflation consistent with emphysema September 2018 lung cancer screening CT scan showed emphysema, RADS 3      Assessment & Plan:   Centrilobular emphysema (HCC) - Plan: Ambulatory Referral for DME  Chronic respiratory failure, unsp w hypoxia or hypercapnia (HCC) - Plan: Ambulatory Referral for DME  Oral thrush  Hx of tobacco use, presenting hazards to health  Discussion: This has been a stable interval for Glenn Allison.  He is actually feeling quite a bit better since he has been exercising more and using oxygen with exertion.  He has not had an exacerbation.  His thrush has been under better control lately.  History of cigarette smoking: I have sent a message to our lung cancer screening coordinator to make sure that she have another CT in March  Centrilobular emphysema with COPD: Continue Trelegy daily Continue to exercise daily We will change your albuterol to pro-air  Chronic  respiratory failure with hypoxemia: Continue using 2 L of oxygen with exertion We will arrange for a portable oxygen concentrator  We will see you back in 4-6 months or sooner if needed    Current Outpatient Medications:  .  albuterol (PROVENTIL) (2.5 MG/3ML) 0.083% nebulizer solution, Take 3 mLs (2.5 mg total) by nebulization every 6 (six) hours as needed for wheezing or shortness of breath. Dx:j44.9, Disp: 75 mL, Rfl: 12 .  aspirin EC 81 MG tablet, Take 1 tablet (81 mg total) by mouth daily., Disp: , Rfl:  .  atorvastatin  (LIPITOR) 40 MG tablet, Take 1 tablet (40 mg total) by mouth daily., Disp: 90 tablet, Rfl: 3 .  citalopram (CELEXA) 20 MG tablet, TAKE 1 TABLET BY MOUTH DAILY (Patient taking differently: TAKE 0.5 TABLET BY MOUTH DAILY), Disp: 30 tablet, Rfl: 10 .  clonazePAM (KLONOPIN) 0.5 MG tablet, Take 1 tablet (0.5 mg total) by mouth 2 (two) times daily as needed for anxiety., Disp: 20 tablet, Rfl: 0 .  ezetimibe (ZETIA) 10 MG tablet, Take 1 tablet (10 mg total) by mouth daily., Disp: 90 tablet, Rfl: 3 .  Fluticasone-Umeclidin-Vilant (TRELEGY ELLIPTA) 100-62.5-25 MCG/INH AEPB, Inhale 1 puff into the lungs daily., Disp: 60 each, Rfl: 6 .  metoprolol succinate (TOPROL-XL) 100 MG 24 hr tablet, TAKE 1 TABLET (100 MG TOTAL) BY MOUTH DAILY. TAKE WITH OR IMMEDIATELY FOLLOWING A MEAL., Disp: 90 tablet, Rfl: 2 .  nitroGLYCERIN (NITROSTAT) 0.4 MG SL tablet, 1 tab sublingual for significant dyspnea, may repeat every 5 minutes x2 if not relieved, Disp: 25 tablet, Rfl: 1 .  nystatin (MYCOSTATIN) 100000 UNIT/ML suspension, Take 5ml 4 times daily for 7 days swish and spit., Disp: 60 mL, Rfl: 0 .  REXULTI 1 MG TABS, Take 1 tablet by mouth every morning., Disp: , Rfl: 3 .  valsartan (DIOVAN) 80 MG tablet, Take 1 tablet (80 mg total) by mouth daily., Disp: 90 tablet, Rfl: 1 .  albuterol (PROAIR HFA) 108 (90 Base) MCG/ACT inhaler, Inhale 1-2 puffs into the lungs every 6 (six) hours as needed for wheezing or shortness of breath., Disp: 1 Inhaler, Rfl: 5 .  hydrochlorothiazide (MICROZIDE) 12.5 MG capsule, Take 1 capsule (12.5 mg total) by mouth daily., Disp: 90 capsule, Rfl: 3

## 2017-05-09 ENCOUNTER — Telehealth: Payer: Self-pay | Admitting: Pulmonary Disease

## 2017-05-09 MED ORDER — ALBUTEROL SULFATE HFA 108 (90 BASE) MCG/ACT IN AERS: 1.0000 | INHALATION_SPRAY | Freq: Four times a day (QID) | RESPIRATORY_TRACT | 5 refills | Status: DC | PRN

## 2017-05-09 NOTE — Telephone Encounter (Signed)
Medication prescription was sent to the correct pharmacy.

## 2017-05-17 ENCOUNTER — Telehealth: Payer: Self-pay | Admitting: Interventional Cardiology

## 2017-05-17 NOTE — Telephone Encounter (Signed)
New message     *STAT* If patient is at the pharmacy, call can be transferred to refill team.   1. Which medications need to be refilled? (please list name of each medication and dose if known) ezetimibe (ZETIA) 10 MG tablet  2. Which pharmacy/location (including street and city if local pharmacy) is medication to be sent to?[id number] - [place], [place] - [address]  3. Do they need a 30 day or 90 day supply? 90

## 2017-05-20 ENCOUNTER — Other Ambulatory Visit: Payer: Self-pay | Admitting: *Deleted

## 2017-05-20 MED ORDER — EZETIMIBE 10 MG PO TABS: 10.0000 mg | ORAL_TABLET | Freq: Every day | ORAL | 3 refills | Status: DC

## 2017-05-20 NOTE — Progress Notes (Signed)
Spoke with wife and she was upset that pt's Zetia had still not been received by the pharmacy.  Verified that [facility] was correct and wife said no, [facility] [address]  Wife asked that [facility] be removed as they do not use this pharmacy.  Advised wife [facility] will remove [facility] and [facility].  Wife appreciative for assistance.

## 2017-06-05 ENCOUNTER — Ambulatory Visit: Payer: Self-pay | Admitting: Cardiology

## 2017-06-06 ENCOUNTER — Telehealth: Payer: Self-pay | Admitting: Pulmonary Disease

## 2017-06-06 ENCOUNTER — Other Ambulatory Visit: Payer: Self-pay | Admitting: Interventional Cardiology

## 2017-06-06 NOTE — Telephone Encounter (Signed)
I have called [facility] and had to LVM for them to call me back about the POC order

## 2017-06-07 NOTE — Telephone Encounter (Signed)
Pt wife calling stating that they have nopt received anything from [facility]  Say it has been over a month, calling  About portable  Concentrator  trying to  Medicare approval please advise can be reached @ [phone].Caren GriffinsStanley A Dalton

## 2017-06-10 NOTE — Telephone Encounter (Signed)
Called James with [facility].  He states Medicare has approved concentrators but what they are waiting on now is Medicaid to approve POC.  He states Ladona Ridgelaylor spoke to pt last week and told him this.  I called pt & gave him info.  [facility] & [facility] told him they do not have the order.  I told him since I just spoke to [facility] today he can call Medicaid today & see what they tell him.  Told him to let us know if anything else is needed.

## 2017-06-24 ENCOUNTER — Other Ambulatory Visit: Payer: Medicaid Other | Admitting: *Deleted

## 2017-06-24 DIAGNOSIS — Z79899 Other long term (current) drug therapy: Secondary | ICD-10-CM

## 2017-06-24 LAB — HEPATIC FUNCTION PANEL
ALT: 49 IU/L — AB (ref 0–44)
AST: 33 IU/L (ref 0–40)
Albumin: 4.3 g/dL (ref 3.6–4.8)
Alkaline Phosphatase: 90 IU/L (ref 39–117)
BILIRUBIN TOTAL: 0.7 mg/dL (ref 0.0–1.2)
BILIRUBIN, DIRECT: 0.19 mg/dL (ref 0.00–0.40)
Total Protein: 7.4 g/dL (ref 6.0–8.5)

## 2017-06-24 LAB — LIPID PANEL
CHOL/HDL RATIO: 2.3 ratio (ref 0.0–5.0)
CHOLESTEROL TOTAL: 133 mg/dL (ref 100–199)
HDL: 57 mg/dL (ref >39)
LDL Calculated: 66 mg/dL (ref 0–99)
TRIGLYCERIDES: 48 mg/dL (ref 0–149)
VLDL Cholesterol Cal: 10 mg/dL (ref 5–40)

## 2017-06-25 ENCOUNTER — Telehealth: Payer: Self-pay

## 2017-06-25 DIAGNOSIS — E785 Hyperlipidemia, unspecified: Secondary | ICD-10-CM

## 2017-06-25 NOTE — Telephone Encounter (Signed)
-----   Message from Leone BrandLaura R Ingold, NP sent at 06/24/2017  4:24 PM EDT ----- Cholesterol is great, no changes, one liver function is mildly elevated recheck AST/ALT in 4 weeks.

## 2017-06-25 NOTE — Telephone Encounter (Signed)
Informed patient of results and verbal understanding expressed.  Repeat LFTs scheduled 5/21. He will keep appointment with Dr. Katrinka BlazingSmith early June. He was grateful for call and agrees with treatment plan.

## 2017-07-10 ENCOUNTER — Telehealth: Payer: Self-pay

## 2017-07-10 NOTE — Telephone Encounter (Signed)
Received fax from [facility] for Trelegy. Tried to complete PA through [facility] and was unable. Called [place] [facility] and spoke with [place], [place] was initiated over the phone. PA has been approved through 4.25.20   PA # is [id number]  Called and spoke with Clifton Custard [facility]. Medication went through for refill. Called and spoke with patient advised that medication has been approved, pharmacy notified and he can pick it up when ready. Nothing further needed.

## 2017-07-11 ENCOUNTER — Ambulatory Visit: Payer: Self-pay | Admitting: Cardiology

## 2017-07-23 ENCOUNTER — Telehealth: Payer: Self-pay | Admitting: Acute Care

## 2017-07-23 NOTE — Telephone Encounter (Signed)
Spoke with Mia at [facility] scheduling.  DX code entered on order form and form re faxed.  Nothing further needed.

## 2017-07-23 NOTE — Telephone Encounter (Signed)
Spoke with Glenn Allison, she needs checkmark on diagnosis code so she can associate the CT. It was left unchecked, can fix the order and fax it to the number below.   [phone]

## 2017-07-30 ENCOUNTER — Other Ambulatory Visit: Payer: Medicaid Other

## 2017-07-30 DIAGNOSIS — E785 Hyperlipidemia, unspecified: Secondary | ICD-10-CM

## 2017-07-30 LAB — HEPATIC FUNCTION PANEL
ALT: 46 IU/L — AB (ref 0–44)
AST: 28 IU/L (ref 0–40)
Albumin: 4.5 g/dL (ref 3.6–4.8)
Alkaline Phosphatase: 97 IU/L (ref 39–117)
Bilirubin Total: 0.7 mg/dL (ref 0.0–1.2)
Bilirubin, Direct: 0.19 mg/dL (ref 0.00–0.40)
Total Protein: 7.1 g/dL (ref 6.0–8.5)

## 2017-07-30 NOTE — Progress Notes (Signed)
Pt has been made aware of normal result and verbalized understanding.  jw 07/30/17

## 2017-08-02 ENCOUNTER — Ambulatory Visit: Payer: Self-pay | Admitting: Cardiology

## 2017-08-10 ENCOUNTER — Other Ambulatory Visit: Payer: Self-pay | Admitting: Interventional Cardiology

## 2017-08-13 NOTE — Progress Notes (Signed)
Cardiology Office Note    Date:  08/14/2017   ID:  Glenn BullionOscar E Tagle, DOB 1955-06-16, MRN [id number]  PCP:  Claybon JabsBrown, Emily, PA-C  Cardiologist: Lesleigh NoeHenry W Larenz Frasier III, MD   Chief Complaint  Patient presents with  . Coronary Artery Disease  . Shortness of Breath    History of Present Illness:  Glenn Allison is a 62 y.o. male who presents for HTN, Hyperlipidemia, COPD, and tobacco abuse.  No complaints.  He is cared for at [facility].  He denies significant chest discomfort.  He does have chronic dyspnea on exertion.  No chest pain.  Did have rebound phenomenon off of metoprolol 20 allow his prescription to expire which was manifested by tachycardia, dizziness, and elevated blood pressure.  Complaints resolved after resuming therapy.   Past Medical History:  Diagnosis Date  . COPD (chronic obstructive pulmonary disease) (HCC) 06/01/2016   Greater than 50-pack-year smoking history  . Hyperlipidemia   . Hypertension     Past Surgical History:  Procedure Laterality Date  . LEFT HEART CATH AND CORONARY ANGIOGRAPHY N/A 07/24/2016   Procedure: Left Heart Cath and Coronary Angiography;  Surgeon: Lyn RecordsSmith, Kachina Niederer W, MD;  [facility];  Service: Cardiovascular;  Laterality: N/A;  . None      Current Medications: Outpatient Medications Prior to Visit  Medication Sig Dispense Refill  . albuterol (PROAIR HFA) 108 (90 Base) MCG/ACT inhaler Inhale 1-2 puffs into the lungs every 6 (six) hours as needed for wheezing or shortness of breath. 1 Inhaler 5  . albuterol (PROVENTIL) (2.5 MG/3ML) 0.083% nebulizer solution Take 3 mLs (2.5 mg total) by nebulization every 6 (six) hours as needed for wheezing or shortness of breath. Dx:j44.9 75 mL 12  . aspirin EC 81 MG tablet Take 1 tablet (81 mg total) by mouth daily.    . citalopram (CELEXA) 20 MG tablet TAKE 1 TABLET BY MOUTH DAILY (Patient taking differently: TAKE 0.5 TABLET BY MOUTH DAILY) 30 tablet 10  . clonazePAM (KLONOPIN) 0.5  MG tablet Take 1 tablet (0.5 mg total) by mouth 2 (two) times daily as needed for anxiety. 20 tablet 0  . ezetimibe (ZETIA) 10 MG tablet Take 1 tablet (10 mg total) by mouth daily. 90 tablet 3  . Fluticasone-Umeclidin-Vilant (TRELEGY ELLIPTA) 100-62.5-25 MCG/INH AEPB Inhale 1 puff into the lungs daily. 60 each 6  . hydrochlorothiazide (MICROZIDE) 12.5 MG capsule TAKE ONE CAPSULE BY MOUTH EVERY DAY 90 capsule 2  . metoprolol succinate (TOPROL-XL) 100 MG 24 hr tablet TAKE 1 TABLET (100 MG TOTAL) BY MOUTH DAILY. TAKE WITH OR IMMEDIATELY FOLLOWING A MEAL. 90 tablet 2  . nitroGLYCERIN (NITROSTAT) 0.4 MG SL tablet 1 tab sublingual for significant dyspnea, may repeat every 5 minutes x2 if not relieved 25 tablet 1  . REXULTI 1 MG TABS Take 1 tablet by mouth every morning.  3  . valsartan (DIOVAN) 80 MG tablet TAKE 1 TABLET BY MOUTH EVERY DAY 90 tablet 3  . atorvastatin (LIPITOR) 40 MG tablet Take 1 tablet (40 mg total) by mouth daily. 90 tablet 3  . nystatin (MYCOSTATIN) 100000 UNIT/ML suspension Take 5ml 4 times daily for 7 days swish and spit. (Patient not taking: Reported on 08/14/2017) 60 mL 0   No facility-administered medications prior to visit.      Allergies:   Patient has no known allergies.   Social History   Socioeconomic History  . Marital status: Single    Spouse name: Not on file  .  Number of children: 0  . Years of education: GTI  . Highest education level: Not on file  Occupational History  . 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 Financial resource strain: Not on file  . Food insecurity:    Worry: Not on file    Inability: Not on file  . Transportation needs:    Medical: Not on file    Non-medical: Not on file  Tobacco Use  . Smoking status: Former Smoker    Packs/day: 1.50    Years: 40.00    Pack years: 60.00    Types: Cigarettes    Start date: 12/01/1972    Last attempt to quit: 03/12/2012    Years since quitting: 5.4  .  Smokeless tobacco: Never Used  Substance and Sexual Activity  . Alcohol use: Yes    Alcohol/week: 0.6 oz    Types: 1 Standard drinks or equivalent per week    Comment: socially  . Drug use: No  . Sexual activity: Yes    Partners: Female  Lifestyle  . Physical activity:    Days per week: Not on file    Minutes per session: Not on file  . Stress: Not on file  Relationships  . Social connections:    Talks on phone: Not on file    Gets together: Not on file    Attends religious service: Not on file    Active member of club or organization: Not on file    Attends meetings of clubs or organizations: Not on file    Relationship status: Not on file  Other Topics Concern  . 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   Grew up in [place] and graduated from [employer].   Made living in [place].   Cared for his mother for 10 years and has ignored his own health.   Not seen by a doctor for 16 years.     Family History:  The patient's family history includes Alcohol abuse in his father; Diabetes in his mother; Heart disease in his mother; Hyperlipidemia in his brother and mother; Hypertension in his mother.   ROS:   Please see the history of present illness.    Excessive fatigue, anxiety, chest tightness related to COPD, shortness of breath with activity. All other systems reviewed and are negative.   PHYSICAL EXAM:   VS:  BP 124/72   Pulse 61   Ht 6' (1.829 m)   Wt 191 lb 6.4 oz (86.8 kg)   BMI 25.96 kg/m    GEN: Well nourished, well developed, in no acute distress  HEENT: normal  Neck: no JVD, carotid bruits, or masses Cardiac: RRR; no murmurs, rubs, or gallops,no edema  Respiratory:  clear to auscultation bilaterally, normal work of breathing GI: soft, nontender, nondistended, + BS MS: no deformity or atrophy  Skin: warm and dry, no rash Neuro:  Alert and Oriented x 3, Strength and sensation  are intact Psych: euthymic mood, full affect  Wt Readings from Last 3 Encounters:  08/14/17 191 lb 6.4 oz (86.8 kg)  05/06/17 184 lb 6.4 oz (83.6 kg)  03/01/17 187 lb 9.6 oz (85.1 kg)      Studies/Labs Reviewed:   EKG:  EKG sinus rhythm, normal appearance.  Recent Labs: 07/30/2017: ALT 46   Lipid Panel    Component Value Date/Time  CHOL 133 06/24/2017 0858   TRIG 48 06/24/2017 0858   HDL 57 06/24/2017 0858   CHOLHDL 2.3 06/24/2017 0858   LDLCALC 66 06/24/2017 0858    Additional studies/ records that were reviewed today include:  none    ASSESSMENT:    1. Essential hypertension   2. Cigarette smoker   3. Centrilobular emphysema (HCC)      PLAN:  In order of problems listed above:  1. BMET today.  Clinical follow-up in 1 year.  2. Still abstaining. 3. Continue O2 therapy as directed by Dr. Kendrick Fries. 4. LDL cholesterol less than 161 mg/dL to target and the most recent was 66.  No recent electrolyte or kidney function evaluations.    Medication Adjustments/Labs and Tests Ordered: Current medicines are reviewed at length with the patient today.  Concerns regarding medicines are outlined above.  Medication changes, Labs and Tests ordered today are listed in the Patient Instructions below. Patient Instructions  Medication Instructions:  Your physician recommends that you continue on your current medications as directed. Please refer to the Current Medication list given to you today.  Labwork: BMET today  Testing/Procedures: None  Follow-Up: Your physician wants you to follow-up in: 1 year with Dr. Katrinka Blazing.  You will receive a reminder letter in the mail two months in advance. If you don't receive a letter, please call our office to schedule the follow-up appointment.   Any Other Special Instructions Will Be Listed Below (If Applicable).     If you need a refill on your cardiac medications before your next appointment, please call your pharmacy.       Signed, Lesleigh Noe, MD  08/14/2017 1:17 PM    [facility] [address] [place], [place], [place]  [address] Phone: [phone]; Fax: [phone]

## 2017-08-14 ENCOUNTER — Encounter: Payer: Self-pay | Admitting: Interventional Cardiology

## 2017-08-14 ENCOUNTER — Ambulatory Visit: Payer: Medicaid Other | Admitting: Interventional Cardiology

## 2017-08-14 ENCOUNTER — Encounter

## 2017-08-14 VITALS — BP 124/72 | HR 61 | Ht 72.0 in | Wt 191.4 lb

## 2017-08-14 DIAGNOSIS — F17210 Nicotine dependence, cigarettes, uncomplicated: Secondary | ICD-10-CM

## 2017-08-14 DIAGNOSIS — J432 Centrilobular emphysema: Secondary | ICD-10-CM

## 2017-08-14 DIAGNOSIS — I10 Essential (primary) hypertension: Secondary | ICD-10-CM | POA: Diagnosis not present

## 2017-08-14 LAB — BASIC METABOLIC PANEL
BUN / CREAT RATIO: 13 (ref 10–24)
BUN: 14 mg/dL (ref 8–27)
CO2: 26 mmol/L (ref 20–29)
CREATININE: 1.1 mg/dL (ref 0.76–1.27)
Calcium: 9.3 mg/dL (ref 8.6–10.2)
Chloride: 100 mmol/L (ref 96–106)
GFR calc Af Amer: 84 mL/min/{1.73_m2} (ref >59)
GFR calc non Af Amer: 73 mL/min/{1.73_m2} (ref >59)
GLUCOSE: 93 mg/dL (ref 65–99)
POTASSIUM: 4.4 mmol/L (ref 3.5–5.2)
SODIUM: 138 mmol/L (ref 134–144)

## 2017-08-14 NOTE — Patient Instructions (Signed)
Medication Instructions:  Your physician recommends that you continue on your current medications as directed. Please refer to the Current Medication list given to you today.  Labwork: BMET today  Testing/Procedures: None  Follow-Up: Your physician wants you to follow-up in: 1 year with Dr. Smith.  You will receive a reminder letter in the mail two months in advance. If you don't receive a letter, please call our office to schedule the follow-up appointment.   Any Other Special Instructions Will Be Listed Below (If Applicable).     If you need a refill on your cardiac medications before your next appointment, please call your pharmacy.   

## 2017-08-23 ENCOUNTER — Encounter: Payer: Self-pay | Admitting: Acute Care

## 2017-08-28 ENCOUNTER — Telehealth: Payer: Self-pay | Admitting: Acute Care

## 2017-08-29 NOTE — Telephone Encounter (Signed)
Will forward to Maralyn SagoSarah to call pt.

## 2017-08-29 NOTE — Telephone Encounter (Signed)
I have called Mr. Glenn Allison with  the results of his low-dose screening CT. I let him   know their  low dose Ct was read as a Lung RADS 4 B indicates suspicious findings for which additional diagnostic testing and or tissue sampling is recommended.The radiologist specified that with this waxing and waning feature he favors this is a benign process possibly infectious or inflammatory.  I explained that we will repeat a low-dose CT in 3 months to follow and exclude possibility of metastatic process.  I have let him know we will order and schedule their 7752-month follow-up scan for 9 / 2019.  Patient verbalized understanding and had no further questions at completion of the call.  Because this [facility], we will have to ensure the [facility] will be available for 5252-month follow-up.  If the [facility] is not available for 8252-month follow-up we will have to pursue getting the scan done under his Medicaid as follow-up pulmonary nodule. Please place order for annual  screening scan for 11/2017 and fax results to PCP. Thanks so much.  Glenn Allison, Please contact [facility] at [facility] and see if she knows that the great will be available in September for Mr. Glenn Allison to have repeat scan done there.  If not can we see what needs to happen in order to get him qualified under his Medicaid.  At that point it can be a CT scan for nodule follow-up versus a screening scan if necessary.  Thank you

## 2017-09-19 NOTE — Telephone Encounter (Signed)
Spoke with Cordelia PenSherry at [facility].  She states they can do the repeat Chest Ct there through the grant as long as it is the same as the lung screening Ct.  [facility] Ct [facility]/2017.

## 2017-10-02 ENCOUNTER — Encounter: Payer: Self-pay | Admitting: Pulmonary Disease

## 2017-10-02 ENCOUNTER — Ambulatory Visit (INDEPENDENT_AMBULATORY_CARE_PROVIDER_SITE_OTHER): Payer: Medicaid Other | Admitting: Pulmonary Disease

## 2017-10-02 VITALS — BP 136/74 | HR 65 | Ht 72.0 in | Wt 194.0 lb

## 2017-10-02 DIAGNOSIS — J432 Centrilobular emphysema: Secondary | ICD-10-CM

## 2017-10-02 DIAGNOSIS — R918 Other nonspecific abnormal finding of lung field: Secondary | ICD-10-CM | POA: Diagnosis not present

## 2017-10-02 MED ORDER — ALBUTEROL SULFATE HFA 108 (90 BASE) MCG/ACT IN AERS: 1.0000 | INHALATION_SPRAY | Freq: Four times a day (QID) | RESPIRATORY_TRACT | 5 refills | Status: DC | PRN

## 2017-10-02 NOTE — Patient Instructions (Signed)
COPD: Continue Trelegy daily Use albuterol as needed for chest tightness wheezing or shortness of breath Stay active  Pulmonary nodules: As we discussed today, you will have a repeat CT scan in September and then we will go over the results come up with an action plan.  You back in September or sooner if needed

## 2017-10-02 NOTE — Progress Notes (Signed)
Subjective:    Patient ID: Glenn Allison, male    DOB: 06/19/1955, 62 y.o.   MRN: [id number]  Synopsis: First seen in 2018 for severe COPD  HPI Chief Complaint  Patient presents with  . Follow-up    pt wants to discuss ct chest from June 2019 further.  breathing is stable.     Glenn Allison says he has been feeling well since the last visit.  He has been exercising more and is now capable of walking over a mile.  When he walks he uses his portable oxygen concentrator with helps.  Sometimes though when he goes to a store he does not use his oxygen.  He does not have much mucus production.  He is compliant with his inhaled medicines.  He uses his albuterol about every third day.  Past Medical History:  Diagnosis Date  . COPD (chronic obstructive pulmonary disease) (HCC) 06/01/2016   Greater than 50-pack-year smoking history  . Hyperlipidemia   . Hypertension       Review of Systems  Constitutional: Negative for chills, fatigue and fever.  HENT: Negative for nosebleeds, postnasal drip and rhinorrhea.   Respiratory: Positive for shortness of breath. Negative for choking and wheezing.   Cardiovascular: Negative for chest pain, palpitations and leg swelling.       Objective:   Physical Exam Vitals:   10/02/17 1005  BP: 136/74  Pulse: 65  SpO2: 97%  Weight: 194 lb (88 kg)  Height: 6' (1.829 m)   RA  Gen: well appearing HENT: OP clear, TM's clear, neck supple PULM: CTA B, normal percussion CV: RRR, no mgr, trace edema GI: BS+, soft, nontender Derm: no cyanosis or rash Psyche: normal mood and affect    Heart Cath: May 2018  90% small first diagonal. The obstructions in the proximal one third of the vessel. Diameter is less than 2 mm.  Widely patent left main, LAD, and ramus intermedius.  40% proximal to mid circumflex. First obtuse marginal with 50% narrowing.  Dominant right coronary with 30-40% mid vessel narrowing.  Normal LV function. End-diastolic pressure is  normal.  PFT: April 2018 ratio 27%, FEV1 0.70 L 20% predicted, FVC 2.62 L 60% predicted, total lung capacity 8.07 L 109% predicted, residual volume 243% predicted, DLCO 13.1 238% predicted  Chest imaging: April April 2018 chest x-ray images independently reviewed showing hyperinflation consistent with emphysema September 2018 lung cancer screening CT scan showed emphysema, RADS 3 08/2017 Low dose CT screening: saber trachea, significant centrilobular emphysema, RUL 9mm nodule, 10.71mm LUL nodule, RML and bilateral LL bronchiectasis; radiology notes that 2 other nodules have resolved      Assessment & Plan:   Centrilobular emphysema (HCC)  Pulmonary nodules  Discussion: I spent a significant amount of time talking to Glenn Allison and his wife today regarding the pulmonary nodules.  She was quite concerned because the official read from radiology on the most recent CT scan described a 7 cm nodule in his right lower lobe.  However, when I have personally reviewed these images with the family today there is no such 7 cm nodule, I believe this is a mass type and I actually meant 7 mm.  There has been growth in the left upper lobe nodule which is concerning and definitely needs to be followed closely.  However, with his bronchiectasis and waxing and waning nodules I think it is best at this point to continue to follow with serial imaging rather than a more invasive technique.  If  any of these nodules have increased in size on the September 2019 repeat CT scan then he will need to have a PET scan and ultimately a biopsy if indicated.  Plan: COPD: Continue Trelegy daily Use albuterol as needed for chest tightness wheezing or shortness of breath Stay active  Pulmonary nodules: As we discussed today, you will have a repeat CT scan in September and then we will go over the results come up with an action plan.  You back in September or sooner if needed  25 minutes spent in direct consultation with this  patient, 40 minute visit    Current Outpatient Medications:  .  albuterol (PROAIR HFA) 108 (90 Base) MCG/ACT inhaler, Inhale 1-2 puffs into the lungs every 6 (six) hours as needed for wheezing or shortness of breath., Disp: 1 Inhaler, Rfl: 5 .  albuterol (PROVENTIL) (2.5 MG/3ML) 0.083% nebulizer solution, Take 3 mLs (2.5 mg total) by nebulization every 6 (six) hours as needed for wheezing or shortness of breath. Dx:j44.9, Disp: 75 mL, Rfl: 12 .  aspirin EC 81 MG tablet, Take 1 tablet (81 mg total) by mouth daily., Disp: , Rfl:  .  atorvastatin (LIPITOR) 40 MG tablet, Take 1 tablet (40 mg total) by mouth daily., Disp: 90 tablet, Rfl: 3 .  citalopram (CELEXA) 20 MG tablet, TAKE 1 TABLET BY MOUTH DAILY (Patient taking differently: TAKE 0.5 TABLET BY MOUTH DAILY), Disp: 30 tablet, Rfl: 10 .  clonazePAM (KLONOPIN) 0.5 MG tablet, Take 1 tablet (0.5 mg total) by mouth 2 (two) times daily as needed for anxiety., Disp: 20 tablet, Rfl: 0 .  ezetimibe (ZETIA) 10 MG tablet, Take 1 tablet (10 mg total) by mouth daily., Disp: 90 tablet, Rfl: 3 .  Fluticasone-Umeclidin-Vilant (TRELEGY ELLIPTA) 100-62.5-25 MCG/INH AEPB, Inhale 1 puff into the lungs daily., Disp: 60 each, Rfl: 6 .  hydrochlorothiazide (MICROZIDE) 12.5 MG capsule, TAKE ONE CAPSULE BY MOUTH EVERY DAY, Disp: 90 capsule, Rfl: 2 .  metoprolol succinate (TOPROL-XL) 100 MG 24 hr tablet, TAKE 1 TABLET (100 MG TOTAL) BY MOUTH DAILY. TAKE WITH OR IMMEDIATELY FOLLOWING A MEAL., Disp: 90 tablet, Rfl: 2 .  nitroGLYCERIN (NITROSTAT) 0.4 MG SL tablet, 1 tab sublingual for significant dyspnea, may repeat every 5 minutes x2 if not relieved, Disp: 25 tablet, Rfl: 1 .  REXULTI 1 MG TABS, Take 1 tablet by mouth every morning., Disp: , Rfl: 3 .  valsartan (DIOVAN) 80 MG tablet, TAKE 1 TABLET BY MOUTH EVERY DAY, Disp: 90 tablet, Rfl: 3

## 2017-10-10 ENCOUNTER — Other Ambulatory Visit: Payer: Self-pay | Admitting: Pulmonary Disease

## 2017-11-15 ENCOUNTER — Telehealth: Payer: Self-pay | Admitting: Acute Care

## 2017-11-15 NOTE — Telephone Encounter (Signed)
Results have been received and personally handed to Maralyn Sago.

## 2017-11-15 NOTE — Telephone Encounter (Signed)
Received call report from Diane with [place] radiology for CT low dose.  Scan is not within epic.  I have requested that Diane fax report to our office.  Will route message to sarah to make aware.   Will hold message in triage until report is received.

## 2017-11-15 NOTE — Telephone Encounter (Signed)
I have called Glenn Allison with the results of his follow up low dose CT. The scan was read as a Lung RADS 4 A : suspicious findings, either short term follow up in 3 months or alternatively  PET Scan evaluation may be considered when there is a solid component of  8 mm or larger. There were 2 nodules that are smaller in size and there is one nodule that is new. Per radiology interpretation, due to the waxing and waning appearance these are suspicious for infection / inflammation.However, recommendation is for 3 month follow up. ( Please do not schedule follow up  until after 02/15/2018)  I have explained this to the patient in full. We will have the patient come to the office for an OV next week , and we will collect sputum for culture to see if we can isolate an organism, and treat the patient prior to the next screening CT in 02/2018.  Angelique Blonder, please see if we can get the patient scanned again at [facility] after 02/15/2018. Please fax results to PCP.

## 2017-11-18 NOTE — Telephone Encounter (Signed)
Will send order to [facility] for Ct chest lung nodule f/u to be done after 02/15/18.

## 2017-11-20 ENCOUNTER — Ambulatory Visit (INDEPENDENT_AMBULATORY_CARE_PROVIDER_SITE_OTHER): Payer: Medicaid Other | Admitting: Acute Care

## 2017-11-20 ENCOUNTER — Encounter: Payer: Self-pay | Admitting: Acute Care

## 2017-11-20 VITALS — BP 124/78 | HR 61 | Ht 70.0 in | Wt 199.8 lb

## 2017-11-20 DIAGNOSIS — R9389 Abnormal findings on diagnostic imaging of other specified body structures: Secondary | ICD-10-CM | POA: Insufficient documentation

## 2017-11-20 DIAGNOSIS — J432 Centrilobular emphysema: Secondary | ICD-10-CM

## 2017-11-20 DIAGNOSIS — G4733 Obstructive sleep apnea (adult) (pediatric): Secondary | ICD-10-CM | POA: Insufficient documentation

## 2017-11-20 NOTE — Assessment & Plan Note (Signed)
Stable interval Plan: Continue Trelegy 1 puff once daily Continue nebulizer treatments for shortness of breath or wheezing Continue oxygen at 3 L [place] with exertion Saturation goals are 88-92% Continue walking for exercise

## 2017-11-20 NOTE — Patient Instructions (Addendum)
It is nice to see you today. We will send sputum for culture, fungus and AFB. We will call you with results Mucinex 1200 mg one to two times daily with full glass of water  Continue Trelegy 1 puff once daily  Rinse mouth after use Continue using your oxygen with exertion at 3 L with exertion Saturation goals are 88-92% Consider non-sedating antihistamine  Consider using flonase 2 squirts each nare once daily. Blow nose prior to use. We will order an overnight sleep test. We will place an order to switch DME Follow up with Dr. Kendrick Fries 12/04/2017 as is already scheduled Please contact office for sooner follow up if symptoms do not improve or worsen or seek emergency care

## 2017-11-20 NOTE — Assessment & Plan Note (Signed)
Lung RADS 4 A : suspicious findings, either short term follow up in 3 months or alternatively  PET Scan evaluation may be considered when there is a solid component of  8 mm or larger. Felt to be waxing and waning with favoring of infection/inflammation Plan: Follow up CT scan in 3 months  We will send sputum for culture, fungus and AFB. We will call you with results Mucinex 1200 mg one to two times daily with full glass of water for thinning of mucus to enhance expectoration

## 2017-11-20 NOTE — Assessment & Plan Note (Addendum)
Patient with complaints of snoring and awakening tired Must consider possibility of COPD/OSA overlap syndrome Plan: Schedule Sleep Study to evaluate for sleep apnea

## 2017-11-20 NOTE — Progress Notes (Signed)
History of Present Illness Glenn Allison is a 62 y.o. male former smoker ( 60 pack year smoking history, quit 2014) with severe COPD followed by Dr. Kendrick Fries since 2018. He uses 3 L oxygen with exertion.   11/20/2017  Pt. Presents for follow up of abnormal CT lung cancer screening scan. Glenn Allison is part of the screening program.  His Lung Cancer  Screening  Between  2018-2019 has been as follows:  11/2016>> Lung RADS 3: Lung  RADS 3, nodules that are probably benign findings, short term follow up suggested: includes nodules with a low likelihood of becoming a clinically active cancer. Radiology recommends a 6 month repeat LDCT follow up. 08/2017>> Lung RADS 4 B indicates suspicious findings for which additional diagnostic testing and or tissue sampling is recommended. Per the impression, waxing and waning appearance favors a benign process possibly infection/ inflammation, metastatic disease not excluded. Seen by Dr. Kendrick Fries 11/2017>> Lung RADS 4 A : suspicious findings, either short term follow up in 3 months or alternatively  PET Scan evaluation may be considered when there is a solid component of  8 mm or larger.Again notation of waxing and waning appearance , infection, inflammation is favored Left upper lobe nodule noted on prior study is now 8.2 mm, previously 10.5 mm Dominant nodule in the posterior right lung base grossly unchanged 13.4 mm previously 13.8 mm New increased 6.5 mm nodule superior segment right lower lobe  We have reviewed the results with the patient and his wife.He is scheduled for a 3 month follow up scan. He is here today to discuss getting a culture of his sputum to allow treatment if there is infection which may be the reason for the waxing and waning CT results. He states he has been at his baseline in regard to his breathing. Marland KitchenHe states he does occasionally cough up sputum that is green. He is compliant with his Trelegy and his nebulizer treatments for shortness of  breath or wheezing. He is using his oxygen at 3 L with exertion. Saturations today in the office were 95% on RA. He walks daily. Patient states he has been snoring. Per his wife he has an abnormal sleeping pattern. He states he awakens tired each morning after sleep of > 8 hours He denies any fever, chest pain,oethopnea or hemoptysis. He has not had weight loss.Pt. And his wife would like a new DME.  Pt. And wife state they want to keep the appointment with Dr. Kendrick Fries 12/04/2017 despite having been seen today. I gave them the option of follow up in a few months which they declined.  Test Results: Heart Cath: May 2018  90% small first diagonal. The obstructions in the proximal one third of the vessel. Diameter is less than 2 mm.  Widely patent left main, LAD, and ramus intermedius.  40% proximal to mid circumflex. First obtuse marginal with 50% narrowing.  Dominant right coronary with 30-40% mid vessel narrowing.  Normal LV function. End-diastolic pressure is normal.  PFT: April 2018 ratio 27%, FEV1 0.70 L 20% predicted, FVC 2.62 L 60% predicted, total lung capacity 8.07 L 109% predicted, residual volume 243% predicted, DLCO 13.1 238% predicted  Chest imaging: April April 2018 chest x-ray images independently reviewed showing hyperinflation consistent with emphysema September 2018 lung cancer screening CT scan showed emphysema, RADS 3 08/2017 Low dose CT screening: saber trachea, significant centrilobular emphysema, RUL 9mm nodule, 10.79mm LUL nodule, RML and bilateral LL bronchiectasis; radiology notes that 2 other nodules have resolved  CBC Latest Ref Rng & Units 07/19/2016 04/27/2016 07/05/2015  WBC 3.4 - 10.8 x10E3/uL 9.2 8.2 9.2  Hemoglobin 13.0 - 17.7 g/dL 40.9 81.1 91.4  Hematocrit 37.5 - 51.0 % 45.0 45.7 46.9  Platelets 150 - 379 x10E3/uL 279 265 336    BMP Latest Ref Rng & Units 08/14/2017 07/20/2016 07/19/2016  Glucose 65 - 99 mg/dL 93 - 782(N)  BUN 8 - 27 mg/dL 14 - 14    Creatinine 5.62 - 1.27 mg/dL 1.30 - 8.65  BUN/Creat Ratio 10 - 24 13 - 15  Sodium 134 - 144 mmol/L 138 - 140  Potassium 3.5 - 5.2 mmol/L 4.4 4.7 5.7(H)  Chloride 96 - 106 mmol/L 100 - 98  CO2 20 - 29 mmol/L 26 - 24  Calcium 8.6 - 10.2 mg/dL 9.3 - 9.9    BNP No results found for: BNP  ProBNP    Component Value Date/Time   PROBNP 33 06/15/2016 1039    PFT    Component Value Date/Time   FEV1PRE 0.62 06/20/2016 0822   FEV1POST 0.70 06/20/2016 0822   FVCPRE 2.22 06/20/2016 0822   FVCPOST 2.62 06/20/2016 0822   TLC 8.07 06/20/2016 0822   DLCOUNC 13.12 06/20/2016 0822   PREFEV1FVCRT 28 06/20/2016 0822   PSTFEV1FVCRT 27 06/20/2016 0822    No results found.   Past medical hx Past Medical History:  Diagnosis Date  . COPD (chronic obstructive pulmonary disease) (HCC) 06/01/2016   Greater than 50-pack-year smoking history  . Hyperlipidemia   . Hypertension      Social History   Tobacco Use  . Smoking status: Former Smoker    Packs/day: 1.50    Years: 40.00    Pack years: 60.00    Types: Cigarettes    Start date: 12/01/1972    Last attempt to quit: 03/12/2012    Years since quitting: 5.6  . Smokeless tobacco: Never Used  Substance Use Topics  . Alcohol use: Yes    Alcohol/week: 1.0 standard drinks    Types: 1 Standard drinks or equivalent per week    Comment: socially  . Drug use: No    Glenn Allison reports that he quit smoking about 5 years ago. His smoking use included cigarettes. He started smoking about 45 years ago. He has a 60.00 pack-year smoking history. He has never used smokeless tobacco. He reports that he drinks about 1.0 standard drinks of alcohol per week. He reports that he does not use drugs.  Tobacco Cessation: Former smoker with a 60 pack year smoking history.Quit 2014  Past surgical hx, Family hx, Social hx all reviewed.  Current Outpatient Medications on File Prior to Visit  Medication Sig  . albuterol (PROAIR HFA) 108 (90 Base) MCG/ACT  inhaler Inhale 1-2 puffs into the lungs every 6 (six) hours as needed for wheezing or shortness of breath.  Marland Kitchen albuterol (PROVENTIL) (2.5 MG/3ML) 0.083% nebulizer solution Take 3 mLs (2.5 mg total) by nebulization every 6 (six) hours as needed for wheezing or shortness of breath. Dx:j44.9  . aspirin EC 81 MG tablet Take 1 tablet (81 mg total) by mouth daily.  . citalopram (CELEXA) 20 MG tablet TAKE 1 TABLET BY MOUTH DAILY (Patient taking differently: TAKE 0.5 TABLET BY MOUTH DAILY)  . clonazePAM (KLONOPIN) 0.5 MG tablet Take 1 tablet (0.5 mg total) by mouth 2 (two) times daily as needed for anxiety.  Marland Kitchen ezetimibe (ZETIA) 10 MG tablet Take 1 tablet (10 mg total) by mouth daily.  . hydrochlorothiazide (MICROZIDE) 12.5 MG capsule  TAKE ONE CAPSULE BY MOUTH EVERY DAY  . metoprolol succinate (TOPROL-XL) 100 MG 24 hr tablet TAKE 1 TABLET (100 MG TOTAL) BY MOUTH DAILY. TAKE WITH OR IMMEDIATELY FOLLOWING A MEAL.  . nitroGLYCERIN (NITROSTAT) 0.4 MG SL tablet 1 tab sublingual for significant dyspnea, may repeat every 5 minutes x2 if not relieved  . REXULTI 1 MG TABS Take 1 tablet by mouth every morning.  . TRELEGY ELLIPTA 100-62.5-25 MCG/INH AEPB TAKE 1 PUFF BY MOUTH EVERY DAY  . valsartan (DIOVAN) 80 MG tablet TAKE 1 TABLET BY MOUTH EVERY DAY  . atorvastatin (LIPITOR) 40 MG tablet Take 1 tablet (40 mg total) by mouth daily.   No current facility-administered medications on file prior to visit.      No Known Allergies  Review Of Systems:  Constitutional:   No  weight loss, night sweats,  Fevers, chills, fatigue, or  lassitude.  HEENT:   No headaches,  Difficulty swallowing,  Tooth/dental problems, or  Sore throat,                No sneezing, itching, ear ache, nasal congestion, post nasal drip,   CV:  No chest pain,  Orthopnea, PND, swelling in lower extremities, anasarca, dizziness, palpitations, syncope.   GI  No heartburn, indigestion, abdominal pain, nausea, vomiting, diarrhea, change in bowel  habits, loss of appetite, bloody stools.   Resp: + shortness of breath with exertion not  at rest.  + occasional  excess mucus, no productive cough,  No non-productive cough,  No coughing up of blood.  No change in color of mucus.  No wheezing.  No chest wall deformity  Skin: no rash or lesions.  GU: no dysuria, change in color of urine, no urgency or frequency.  No flank pain, no hematuria   MS:  No joint pain or swelling.  No decreased range of motion.  No back pain.  Psych:  No change in mood or affect. No depression or anxiety.  No memory loss.   Vital Signs BP 124/78 (BP Location: Right Arm, Cuff Size: Normal)   Pulse 61   Ht 5\' 10"  (1.778 m)   Wt 199 lb 12.8 oz (90.6 kg)   SpO2 95%   BMI 28.67 kg/m    Physical Exam:  General- No distress,  A&Ox3, pleasant  ENT: No sinus tenderness, TM clear, pale nasal mucosa, no oral exudate,no post nasal drip, no LAN Cardiac: S1, S2, regular rate and rhythm, no murmur Chest: No wheeze/ rales/ dullness; no accessory muscle use, no nasal flaring, no sternal retractions Abd.: Soft Non-tender, ND , BS +, Body mass index is 28.67 kg/m. Ext: No clubbing cyanosis, edema Neuro:  normal strength Skin: No rashes, warm and dry Psych: normal mood and behavior   Assessment/Plan  Abnormal screening computed tomography (CT) of chest Lung RADS 4 A : suspicious findings, either short term follow up in 3 months or alternatively  PET Scan evaluation may be considered when there is a solid component of  8 mm or larger. Felt to be waxing and waning with favoring of infection/inflammation Plan: Follow up CT scan in 3 months  We will send sputum for culture, fungus and AFB. We will call you with results Mucinex 1200 mg one to two times daily with full glass of water for thinning of mucus to enhance expectoration   COPD (chronic obstructive pulmonary disease) (HCC) Stable interval Plan: Continue Trelegy 1 puff once daily Continue nebulizer  treatments for shortness of breath or wheezing Continue  oxygen at 3 L [place] with exertion Saturation goals are 88-92% Continue walking for exercise  OSA (obstructive sleep apnea) Patient with complaints of snoring and awakening tired Must consider possibility of COPD/OSA overlap syndrome Plan: Schedule Sleep Study to evaluate for sleep apnea     Bevelyn Ngo, NP 11/20/2017  5:01 PM

## 2017-11-21 ENCOUNTER — Telehealth: Payer: Self-pay | Admitting: Acute Care

## 2017-11-21 NOTE — Telephone Encounter (Signed)
LMTCB

## 2017-11-21 NOTE — Telephone Encounter (Signed)
[facility] [phone]/[phone]/kob

## 2017-11-21 NOTE — Telephone Encounter (Signed)
[facility], patient will need and office visit and will need to be requalified for oxygen to switch DME companies. Called and spoke with patient, made patient aware that he would need to be re-qualified. Patient was offered a sooner appointment but declined stating he would wait until his appointment on 12/04/2017. Will route this information to BQ as a FYI.

## 2017-11-22 NOTE — Telephone Encounter (Signed)
OK 

## 2017-11-25 ENCOUNTER — Telehealth: Payer: Self-pay | Admitting: Acute Care

## 2017-11-25 NOTE — Telephone Encounter (Signed)
Called and spoke with patient, he stated that he has been unable to get any sputum up for the culture. He is wanting to know if he needs to continue to try or can a prescription be called in.   SG please advise, thank you.

## 2017-11-26 MED ORDER — PREDNISONE 10 MG PO TABS: ORAL_TABLET | ORAL | 0 refills | Status: DC

## 2017-11-26 MED ORDER — DOXYCYCLINE HYCLATE 100 MG PO TABS: 100.0000 mg | ORAL_TABLET | Freq: Two times a day (BID) | ORAL | 0 refills | Status: DC

## 2017-11-26 NOTE — Telephone Encounter (Signed)
Spoke with pt and advised rx sent to pharmacy. Nothing further is needed.   

## 2017-11-26 NOTE — Telephone Encounter (Signed)
I have called the patient. He endorses worsening shortness of breath and wheezing. More fatigue.He has been unable to produce a sputum specimen for culture.We will treat as a COPD flare, and in 6-8 weeks we will try again for a sputum culture to see if we can evaluate for chronic infection and possibly treat based on sensitivities.  Please send in Doxycycline 100 mg BID x 7 days and prednisone taper 30 mg x 2 days, 20 mg x 2 days, 10 mg x 2 days then stop. Please get patient scheduled for follow up with me in 2 weeks. Thanks so much.

## 2017-11-26 NOTE — Telephone Encounter (Signed)
Rx's sent in to the pharmacy. Nothing further is needed.

## 2017-12-04 ENCOUNTER — Ambulatory Visit: Payer: Self-pay | Admitting: Pulmonary Disease

## 2017-12-11 ENCOUNTER — Ambulatory Visit (INDEPENDENT_AMBULATORY_CARE_PROVIDER_SITE_OTHER): Payer: Medicaid Other | Admitting: Pulmonary Disease

## 2017-12-11 ENCOUNTER — Encounter: Payer: Self-pay | Admitting: Pulmonary Disease

## 2017-12-11 VITALS — BP 138/84 | HR 72 | Ht 71.0 in | Wt 190.6 lb

## 2017-12-11 DIAGNOSIS — J9610 Chronic respiratory failure, unspecified whether with hypoxia or hypercapnia: Secondary | ICD-10-CM

## 2017-12-11 DIAGNOSIS — R9389 Abnormal findings on diagnostic imaging of other specified body structures: Secondary | ICD-10-CM

## 2017-12-11 DIAGNOSIS — Z23 Encounter for immunization: Secondary | ICD-10-CM

## 2017-12-11 DIAGNOSIS — J432 Centrilobular emphysema: Secondary | ICD-10-CM | POA: Diagnosis not present

## 2017-12-11 DIAGNOSIS — R918 Other nonspecific abnormal finding of lung field: Secondary | ICD-10-CM | POA: Diagnosis not present

## 2017-12-11 MED ORDER — FLUTICASONE-UMECLIDIN-VILANT 100-62.5-25 MCG/INH IN AEPB: 1.0000 | INHALATION_SPRAY | Freq: Every day | RESPIRATORY_TRACT | 5 refills | Status: DC

## 2017-12-11 NOTE — Patient Instructions (Signed)
Waxing and waning pulmonary nodules: I would like to collect mucus from you as Glenn Allison has ordered already In about 2 weeks please provide [place] with a sample of mucus I agree with plans for you to have another CT scan of your chest in December  COPD with recurrent exacerbations, severe: Continue taking Trelegy, we will give you a written prescription for this today If you change your mind about the flu shot we are happy to administer it Pneumovax vaccine today Practice good hand hygiene Stay active, continue exercising  Chronic respiratory failure with hypoxemia: Keep using 3 L of oxygen when you exert yourself  We will see you back in December 2019 or sooner if needed  Greater than 50% of this 30-minute visit spent face-to-face

## 2017-12-11 NOTE — Progress Notes (Signed)
Subjective:    Patient ID: Glenn Allison, male    DOB: 10/10/1955, 62 y.o.   MRN: [id number]  Synopsis: First seen in 2018 for severe COPD, needed supplemental oxygen in 2019.  Noted to have multiple pulmonary nodules in 2019.  HPI Chief Complaint  Patient presents with  . Follow-up    improved - feels the antibiotic helped.     He was sick earlier this month and called our office because of increased wheezing, coughing and shortness of breath.  We called in a prescription for prednisone and doxycycline and he said he felt "great while on this but then his energy level started to drop again afterwards.  Mr. Ducat has decreased energy today.  He says that while walking he feels faituged. He walks "like an old man" and gets fatigued after 1/2 mile. He typically has to stop at that point.    He hasn't had any cough or mucus production.    He has night sweats, but he has not lost weight and he is not producing mucus.      Past Medical History:  Diagnosis Date  . COPD (chronic obstructive pulmonary disease) (HCC) 06/01/2016   Greater than 50-pack-year smoking history  . Hyperlipidemia   . Hypertension       Review of Systems  Constitutional: Negative for chills, fatigue and fever.  HENT: Negative for nosebleeds, postnasal drip and rhinorrhea.   Respiratory: Positive for shortness of breath. Negative for choking and wheezing.   Cardiovascular: Negative for chest pain, palpitations and leg swelling.       Objective:   Physical Exam Vitals:   12/11/17 1450  BP: 138/84  Pulse: 72  SpO2: 96%  Weight: 190 lb 9.6 oz (86.5 kg)  Height: 5\' 11"  (1.803 m)   Walked 500 feet on room air, O2 saturation drops to 87%, required 3 L to maintain O2 saturation greater than 89%  Gen: well appearing HENT: OP clear, TM's clear, neck supple PULM: Poor air movement but no wheezing B, normal percussion CV: RRR, no mgr, trace edema GI: BS+, soft, nontender Derm: no cyanosis or  rash Psyche: normal mood and affect    Heart Cath: May 2018  90% small first diagonal. The obstructions in the proximal one third of the vessel. Diameter is less than 2 mm.  Widely patent left main, LAD, and ramus intermedius.  40% proximal to mid circumflex. First obtuse marginal with 50% narrowing.  Dominant right coronary with 30-40% mid vessel narrowing.  Normal LV function. End-diastolic pressure is normal.  PFT: April 2018 ratio 27%, FEV1 0.70 L 20% predicted, FVC 2.62 L 60% predicted, total lung capacity 8.07 L 109% predicted, residual volume 243% predicted, DLCO 13.1 238% predicted  Chest imaging: April April 2018 chest x-ray images independently reviewed showing hyperinflation consistent with emphysema September 2018 lung cancer screening CT scan showed emphysema, RADS 3 08/2017 Low dose CT screening: saber trachea, significant centrilobular emphysema, RUL 9mm nodule, 10.69mm LUL nodule, RML and bilateral LL bronchiectasis; radiology notes that 2 other nodules have resolved November 15, 2017 CT chest showed lung RADS for a suspicious, needs continued follow-up: Right lower lobe 6.5 mm nodule is new, other nodules (right lung base 13.4 mm nodule decreased from 13.8 and left upper lobe 8.2 mm, decreased from 10.5 mm) have both decreased in size.  The radiologist indicated that they suspected an inflammatory cause for these nodules.      Assessment & Plan:   Centrilobular emphysema (HCC)  Abnormal  screening computed tomography (CT) of chest  Pulmonary nodules  Chronic respiratory failure, unsp w hypoxia or hypercapnia (HCC)  Discussion: Jarnell had another exacerbation earlier this month which is now resolved after antibiotics and prednisone.  I wonder whether or not the inflammatory nodules (presumed) which have been waxing and waning on his CT chest are indicative of an underlying chronic infection which could also be connected to his exacerbations.  I think the best  approach is to obtain some mucus from him to look for evidence of MAI.  I have advised that he needs to be off of antibiotics for at least 14 days prior to collecting a sample.  At this point in regards to these inflammatory nodules the only signs or symptoms of an atypical infection like MAI that I see are an abnormal CT scan of the chest and the occasional night sweats.  So it is difficult for me to justify a bronchoscopy as I doubt that I would treat him with such minimal symptoms.  Plan: Waxing and waning pulmonary nodules: I would like to collect mucus from you as Maralyn Sago has ordered already In about 2 weeks please provide [place] with a sample of mucus I agree with plans for you to have another CT scan of your chest in December  COPD with recurrent exacerbations, severe: Continue taking Trelegy, we will give you a written prescription for this today If you change your mind about the flu shot we are happy to administer it Pneumovax vaccine today Practice good hand hygiene Stay active, continue exercising  Chronic respiratory failure with hypoxemia: Keep using 3 L of oxygen when you exert yourself  We will see you back in December 2019 or sooner if needed  Greater than 50% of this 30-minute visit spent face-to-face    Current Outpatient Medications:  .  albuterol (PROAIR HFA) 108 (90 Base) MCG/ACT inhaler, Inhale 1-2 puffs into the lungs every 6 (six) hours as needed for wheezing or shortness of breath., Disp: 1 Inhaler, Rfl: 5 .  albuterol (PROVENTIL) (2.5 MG/3ML) 0.083% nebulizer solution, Take 3 mLs (2.5 mg total) by nebulization every 6 (six) hours as needed for wheezing or shortness of breath. Dx:j44.9, Disp: 75 mL, Rfl: 12 .  aspirin EC 81 MG tablet, Take 1 tablet (81 mg total) by mouth daily., Disp: , Rfl:  .  citalopram (CELEXA) 20 MG tablet, TAKE 1 TABLET BY MOUTH DAILY (Patient taking differently: TAKE 0.5 TABLET BY MOUTH DAILY), Disp: 30 tablet, Rfl: 10 .  clonazePAM (KLONOPIN)  0.5 MG tablet, Take 1 tablet (0.5 mg total) by mouth 2 (two) times daily as needed for anxiety., Disp: 20 tablet, Rfl: 0 .  doxycycline (VIBRA-TABS) 100 MG tablet, Take 1 tablet (100 mg total) by mouth 2 (two) times daily., Disp: 14 tablet, Rfl: 0 .  ezetimibe (ZETIA) 10 MG tablet, Take 1 tablet (10 mg total) by mouth daily., Disp: 90 tablet, Rfl: 3 .  Fluticasone-Umeclidin-Vilant (TRELEGY ELLIPTA) 100-62.5-25 MCG/INH AEPB, Inhale 1 puff into the lungs daily., Disp: 1 each, Rfl: 5 .  hydrochlorothiazide (MICROZIDE) 12.5 MG capsule, TAKE ONE CAPSULE BY MOUTH EVERY DAY, Disp: 90 capsule, Rfl: 2 .  metoprolol succinate (TOPROL-XL) 100 MG 24 hr tablet, TAKE 1 TABLET (100 MG TOTAL) BY MOUTH DAILY. TAKE WITH OR IMMEDIATELY FOLLOWING A MEAL., Disp: 90 tablet, Rfl: 2 .  nitroGLYCERIN (NITROSTAT) 0.4 MG SL tablet, 1 tab sublingual for significant dyspnea, may repeat every 5 minutes x2 if not relieved, Disp: 25 tablet, Rfl: 1 .  predniSONE (DELTASONE) 10 MG tablet, Take 3 tabs for 2 days, 2 tabs for 2 days, then 1 tab for 2 days, then stop., Disp: 12 tablet, Rfl: 0 .  REXULTI 1 MG TABS, Take 1 tablet by mouth every other day. , Disp: , Rfl: 3 .  valsartan (DIOVAN) 80 MG tablet, TAKE 1 TABLET BY MOUTH EVERY DAY, Disp: 90 tablet, Rfl: 3 .  atorvastatin (LIPITOR) 40 MG tablet, Take 1 tablet (40 mg total) by mouth daily., Disp: 90 tablet, Rfl: 3

## 2017-12-16 ENCOUNTER — Ambulatory Visit (HOSPITAL_BASED_OUTPATIENT_CLINIC_OR_DEPARTMENT_OTHER): Payer: Medicaid Other | Attending: Acute Care | Admitting: Pulmonary Disease

## 2017-12-16 VITALS — Ht 72.0 in | Wt 195.0 lb

## 2017-12-16 DIAGNOSIS — G4731 Primary central sleep apnea: Secondary | ICD-10-CM | POA: Insufficient documentation

## 2017-12-16 DIAGNOSIS — G4733 Obstructive sleep apnea (adult) (pediatric): Secondary | ICD-10-CM | POA: Diagnosis not present

## 2017-12-19 DIAGNOSIS — G4733 Obstructive sleep apnea (adult) (pediatric): Secondary | ICD-10-CM

## 2017-12-19 NOTE — Procedures (Signed)
  Patient Name: Glenn Allison, Glenn Allison Date: 12/16/2017   Gender: Male  D.O.B: [birth year]  Age (years): 74  Referring Provider: Bevelyn Ngo NP  Height (inches): 72  Interpreting Physician: Cyril Mourning MD, ABSM  Weight (lbs): 195  RPSGT: Lowry Ram  BMI: 26  MRN: [id number]  Neck Size: 16.00  <br> <br>  CLINICAL INFORMATION  Sleep Study Type: NPSG Indication for sleep study: COPD, Depression Epworth Sleepiness Score: 3 SLEEP STUDY TECHNIQUE  As per the AASM Manual for the Scoring of Sleep and Associated Events v2.3 (April 2016) with a hypopnea requiring 4% desaturations. The channels recorded and monitored were frontal, central and occipital EEG, electrooculogram (EOG), submentalis EMG (chin), nasal and oral airflow, thoracic and abdominal wall motion, anterior tibialis EMG, snore microphone, electrocardiogram, and pulse oximetry. MEDICATIONS  Medications self-administered by patient taken the night of the study : N/A SLEEP ARCHITECTURE  The study was initiated at 10:55:34 PM and ended at 5:41:39 AM. Sleep onset time was 41.7 minutes and the sleep efficiency was 76.7%%. The total sleep time was 311.5 minutes. Stage REM latency was 252.5 minutes. The patient spent 13.2%% of the night in stage N1 sleep, 77.4%% in stage N2 sleep, 0.0%% in stage N3 and 9.5% in REM. Alpha intrusion was absent. Supine sleep was 44.14%. RESPIRATORY PARAMETERS  The overall apnea/hypopnea index (AHI) was 15.0 per hour. There were 77 total apneas, including 33 obstructive, 44 central and 0 mixed apneas. There were 1 hypopneas and 42 RERAs. The AHI during Stage REM sleep was 12.2 per hour. AHI while supine was 25.7 per hour. The mean oxygen saturation was 91.1%. The minimum SpO2 during sleep was 86.0%. soft snoring was noted during this study. CARDIAC DATA  The 2 lead EKG demonstrated sinus rhythm. The mean heart rate was 51.4 beats per minute. Other EKG findings include: None.  LEG MOVEMENT DATA   The total PLMS were 0 with a resulting PLMS index of 0.0. Associated arousal with leg movement index was 0.0 . IMPRESSIONS  - Mild obstructive sleep apnea occurred during this study (AHI = 15.0/h), especially during supine sleep - Mild central sleep apnea occurred during this study (CAI = 8.5/h).  - Mild oxygen desaturation was noted during this study (Min O2 = 86.0%). No oxygen was added - The patient snored with soft snoring volume.  - No cardiac abnormalities were noted during this study.  - Clinically significant periodic limb movements did not occur during sleep. No significant associated arousals. DIAGNOSIS  - Obstructive Sleep Apnea (327.23 [G47.33 ICD-10]) - Central Sleep Apnea (327.27 [G47.37 ICD-10]) RECOMMENDATIONS  - Treatment options include positional therapy alone or CPAP or oral appliance if he is very symptomatic - Positional therapy avoiding supine position during sleep.  - Avoid alcohol, sedatives and other CNS depressants that may worsen sleep apnea and disrupt normal sleep architecture.  - Sleep hygiene should be reviewed to assess factors that may improve sleep quality.  - Weight management and regular exercise should be initiated or continued if appropriate.   Cyril Mourning MD Board Certified in Sleep medicine

## 2017-12-20 ENCOUNTER — Telehealth: Payer: Self-pay | Admitting: Acute Care

## 2017-12-20 NOTE — Telephone Encounter (Signed)
Please call patient and let him know his sleep study confirmed Mild Obstructive Sleep Apnea.Please schedule him for an office visit with me or another NP   to go over results and discuss options for treatment. Thanks so much.

## 2017-12-20 NOTE — Telephone Encounter (Signed)
LMTCB and will hold in triage since Maralyn Sago initiated

## 2017-12-20 NOTE — Telephone Encounter (Signed)
Patient returning call, CB is [phone]

## 2017-12-20 NOTE — Telephone Encounter (Signed)
Called and spoke with patient. We scheduled an appointment to go over sleep study results on Friday 12/27/2017 with Angus Seller NP.  Nothing further needed.

## 2017-12-26 ENCOUNTER — Telehealth: Payer: Self-pay | Admitting: Pulmonary Disease

## 2017-12-26 NOTE — Telephone Encounter (Signed)
Called and spoke with pt who stated he wants to switch from [facility] to [facility] for his oxygen needs.  Pt stated he had a POC that had to be sent to [facility] for repairs and it has been there for about 30 days and he has not heard anything yet from them.  Pt states he uses 3L as needed for exertion.  I made patient aware that [facility] does not have any POCs at this time and he expressed understanding and stated he still wanted to be switched to them for his O2 needs.  An order was placed on 9/11 for pt to have the switch from [facility] to [facility] for his oxygen.  At pt's last OV 12/11/17, pt was requalified for the O2. Nothing further needed.

## 2017-12-27 ENCOUNTER — Encounter: Payer: Self-pay | Admitting: Nurse Practitioner

## 2017-12-27 ENCOUNTER — Ambulatory Visit (INDEPENDENT_AMBULATORY_CARE_PROVIDER_SITE_OTHER): Payer: Medicaid Other | Admitting: Nurse Practitioner

## 2017-12-27 VITALS — BP 126/70 | HR 74 | Ht 71.0 in | Wt 191.4 lb

## 2017-12-27 DIAGNOSIS — G4733 Obstructive sleep apnea (adult) (pediatric): Secondary | ICD-10-CM

## 2017-12-27 NOTE — Assessment & Plan Note (Signed)
Patient Instructions  Mild obstructive sleep apnea - AHI while supine was 25.7  - average AHI was 15.0 Patient cannot sleep well on side due to shoulder injury/pain [facility]AP [facility] CPAP AutoSet 5-15 cmH20 Patient prefers to try nasal pillows Continue current medications Do not drive if drowsy Goal: wear CPAP each night at least 4 hours per night Follow up 1 month after starting CPAP or sooner if needed

## 2017-12-27 NOTE — Patient Instructions (Signed)
Mild obstructive sleep apnea - AHI while supine was 25.7  - average AHI was 15.0 Patient cannot sleep well on side due to shoulder injury/pain Will [facility] CPAP [facility] CPAP AutoSet 5-15 cmH20 Patient prefers to try nasal pillows Continue current medications Do not drive if drowsy Goal: wear CPAP each night at least 4 hours per night Follow up 1 month after starting CPAP or sooner if needed

## 2017-12-27 NOTE — Progress Notes (Signed)
@Patient  ID: Glenn Allison, male    DOB: [birth year], 62 y.o.   MRN: [id number]  Chief Complaint  Patient presents with  . Results    Discuss HST results.    Referring provider: Claybon Jabs, PA-C  HPI  62 year old male with COPD, chronic respiratory failure with hypoxia and hypercapnia followed by Dr. Kendrick Fries.   Tests:  NPSG 12/16/17>>AHI 15.O, SPO2 86%, AHI while supine was 25.7 per hour.   OV 12/27/17 - follow up sleep study Patient presents for follow up on sleep study. Sleep study showed mild-moderate sleep apnea. Patient states that his symptoms are significant. Him and his girlfriend complain of snoring. His girlfriend has witnessed apneic episodes with heavy snoring.  He states that he is tired throughout the day. We discussed sleep positions, but he states that he would have a hard time sleeping on his side due to shoulder injury/pain. He is reluctant to try a dental device. He would like to try CPAP with nasal pillows. He denies any recent chest pain, fever, or edema.    No Known Allergies  Immunization History  Administered Date(s) Administered  . Pneumococcal Conjugate-13 10/12/2016  . Pneumococcal Polysaccharide-23 12/11/2017    Past Medical History:  Diagnosis Date  . COPD (chronic obstructive pulmonary disease) (HCC) 06/01/2016   Greater than 50-pack-year smoking history  . Hyperlipidemia   . Hypertension     Tobacco History: Social History   Tobacco Use  Smoking Status Former Smoker  . Packs/day: 1.50  . Years: 40.00  . Pack years: 60.00  . Types: Cigarettes  . Start date: 12/01/1972  . Last attempt to quit: 03/12/2012  . Years since quitting: 5.7  Smokeless Tobacco Never Used   Counseling given: Yes   Outpatient Encounter Medications as of 12/27/2017  Medication Sig  . albuterol (PROAIR HFA) 108 (90 Base) MCG/ACT inhaler Inhale 1-2 puffs into the lungs every 6 (six) hours as needed for wheezing or shortness of breath.  Marland Kitchen albuterol (PROVENTIL)  (2.5 MG/3ML) 0.083% nebulizer solution Take 3 mLs (2.5 mg total) by nebulization every 6 (six) hours as needed for wheezing or shortness of breath. Dx:j44.9  . aspirin EC 81 MG tablet Take 1 tablet (81 mg total) by mouth daily.  . citalopram (CELEXA) 20 MG tablet TAKE 1 TABLET BY MOUTH DAILY (Patient taking differently: TAKE 0.5 TABLET BY MOUTH DAILY)  . clonazePAM (KLONOPIN) 0.5 MG tablet Take 1 tablet (0.5 mg total) by mouth 2 (two) times daily as needed for anxiety.  Marland Kitchen doxycycline (VIBRA-TABS) 100 MG tablet Take 1 tablet (100 mg total) by mouth 2 (two) times daily.  Marland Kitchen ezetimibe (ZETIA) 10 MG tablet Take 1 tablet (10 mg total) by mouth daily.  . Fluticasone-Umeclidin-Vilant (TRELEGY ELLIPTA) 100-62.5-25 MCG/INH AEPB Inhale 1 puff into the lungs daily.  . hydrochlorothiazide (MICROZIDE) 12.5 MG capsule TAKE ONE CAPSULE BY MOUTH EVERY DAY  . metoprolol succinate (TOPROL-XL) 100 MG 24 hr tablet TAKE 1 TABLET (100 MG TOTAL) BY MOUTH DAILY. TAKE WITH OR IMMEDIATELY FOLLOWING A MEAL.  . nitroGLYCERIN (NITROSTAT) 0.4 MG SL tablet 1 tab sublingual for significant dyspnea, may repeat every 5 minutes x2 if not relieved  . predniSONE (DELTASONE) 10 MG tablet Take 3 tabs for 2 days, 2 tabs for 2 days, then 1 tab for 2 days, then stop.  Marland Kitchen REXULTI 0.25 MG TABS Take 1 tablet by mouth every morning.  . valsartan (DIOVAN) 80 MG tablet TAKE 1 TABLET BY MOUTH EVERY DAY  . atorvastatin (LIPITOR)  40 MG tablet Take 1 tablet (40 mg total) by mouth daily.  . [DISCONTINUED] REXULTI 1 MG TABS Take 1 tablet by mouth every other day.    No facility-administered encounter medications on file as of 12/27/2017.      Review of Systems  Review of Systems  Constitutional: Negative.  Negative for chills and fever.  HENT: Negative.   Respiratory: Negative for cough, shortness of breath and wheezing.   Cardiovascular: Negative.  Negative for chest pain, palpitations and leg swelling.  Gastrointestinal: Negative.     Allergic/Immunologic: Negative.   Neurological: Negative.   Psychiatric/Behavioral: Negative.        Physical Exam  BP 126/70 (BP Location: Left Arm, Patient Position: Sitting, Cuff Size: Normal)   Pulse 74   Ht 5\' 11"  (1.803 m)   Wt 191 lb 6.4 oz (86.8 kg)   SpO2 (!) 86%   BMI 26.69 kg/m   Wt Readings from Last 5 Encounters:  12/27/17 191 lb 6.4 oz (86.8 kg)  12/16/17 195 lb (88.5 kg)  12/11/17 190 lb 9.6 oz (86.5 kg)  11/20/17 199 lb 12.8 oz (90.6 kg)  10/02/17 194 lb (88 kg)     Physical Exam  Constitutional: He is oriented to person, place, and time. He appears well-developed and well-nourished. No distress.  Cardiovascular: Normal rate and regular rhythm.  Pulmonary/Chest: Breath sounds normal. He is in respiratory distress. He has no wheezes.  Musculoskeletal: He exhibits no edema.  Neurological: He is alert and oriented to person, place, and time.  Skin: Skin is warm and dry.  Psychiatric: He has a normal mood and affect.  Nursing note and vitals reviewed.     Assessment & Plan:   OSA (obstructive sleep apnea) Patient Instructions  Mild obstructive sleep apnea - AHI while supine was 25.7  - average AHI was 15.0 Patient cannot sleep well on side due to shoulder injury/pain Will [facility] CPAP [facility] CPAP AutoSet 5-15 cmH20 Patient prefers to try nasal pillows Continue current medications Do not drive if drowsy Goal: wear CPAP each night at least 4 hours per night Follow up 1 month after starting CPAP or sooner if needed      Discussion:  Obesity. - discussed how weight can impact sleep and risk for sleep disordered breathing - discussed options to assist with weight loss: combination of diet modification, cardiovascular and strength training exercises   Cardiovascular risk. - had an extensive discussion regarding the adverse health consequences related to untreated sleep disordered breathing - specifically discussed the risks  for hypertension, coronary artery disease, cardiac dysrhythmias, cerebrovascular disease, and diabetes - lifestyle modification discussed   Safe driving practices. - discussed how sleep disruption can increase risk of accidents, particularly when driving - safe driving practices were discussed   Therapies for obstructive sleep apnea. - if the sleep study shows significant sleep apnea, then various therapies for treatment were reviewed: CPAP, oral appliance, and surgical interventions    Ivonne Andrew, NP 12/27/2017

## 2017-12-30 NOTE — Progress Notes (Signed)
Reviewed, agree 

## 2018-02-04 ENCOUNTER — Telehealth: Payer: Self-pay | Admitting: Pulmonary Disease

## 2018-02-04 NOTE — Telephone Encounter (Signed)
Spoke with patient. He stated that he [facility] but did not receive a POC. [facility] that the patient state are too heavy for him to carry around. [facility] but wants to check to see if this has changed. I advised him that [facility] since it is after 5pm. He verbalized understanding. [facility].

## 2018-02-05 ENCOUNTER — Other Ambulatory Visit: Payer: Self-pay | Admitting: Cardiology

## 2018-02-05 DIAGNOSIS — I10 Essential (primary) hypertension: Secondary | ICD-10-CM

## 2018-02-05 NOTE — Telephone Encounter (Signed)
Called and spoke with patient, informed patient [facility] does not know when they will be getting POC's. Patient states he would rather keep his equipment from [facility] until he is able to switch to a DME company that provides POC's. It was also explained to the patient that he would need to be re-qualified for his oxygen in order to be change. Patient voiced understanding.   [facility].   Call made to [facility] to have them hold off on picking up their oxygen equipment.   Will route message to Lynn ItoBetti Joe so she is aware that at the patients appointment 12.2.19 he will need to be re-qualified for oxygen so that he can switch DME companies.

## 2018-02-10 ENCOUNTER — Ambulatory Visit: Payer: Medicaid Other | Admitting: Pulmonary Disease

## 2018-02-10 ENCOUNTER — Encounter: Payer: Self-pay | Admitting: Pulmonary Disease

## 2018-02-10 VITALS — BP 142/90 | HR 86 | Ht 71.06 in | Wt 188.8 lb

## 2018-02-10 DIAGNOSIS — J432 Centrilobular emphysema: Secondary | ICD-10-CM

## 2018-02-10 DIAGNOSIS — J9610 Chronic respiratory failure, unspecified whether with hypoxia or hypercapnia: Secondary | ICD-10-CM

## 2018-02-10 DIAGNOSIS — R918 Other nonspecific abnormal finding of lung field: Secondary | ICD-10-CM

## 2018-02-10 DIAGNOSIS — G4733 Obstructive sleep apnea (adult) (pediatric): Secondary | ICD-10-CM | POA: Diagnosis not present

## 2018-02-10 NOTE — Telephone Encounter (Signed)
Patient was here for OV today. Walked patient and patient qualifies for continuous oxygen at 3 L.  Patient did not quailify for the POC. We tested with the POC and his oxygen on 5 pulsed dropped to 86%.  Patient is going to keep oxygen with [facility] at this time and [facility]. Nothing further needed.

## 2018-02-10 NOTE — Patient Instructions (Signed)
Waxing and waning pulmonary nodules, multiple: Repeat CT scan of the chest later this week, we will call you with the results  Severe COPD: Continue Trelegy 1 puff daily Stay active, exercise regularly Continue to use albuterol as needed for chest tightness wheezing or shortness of breath If you decide to have a flu shot please let us know Practice good hand hygiene  Severe shortness of breath: Exercise consistently on a day-to-day basis I recommend that she do resistance training at least twice per week  Chronic respiratory failure with hypoxemia: We will check your oxygen today with a portable oxygen concentrator and if you qualify we will prescribe one for you to use at 3 L/min Room air at rest  We will see you back in 3 months or sooner if needed

## 2018-02-10 NOTE — Progress Notes (Signed)
Subjective:    Patient ID: Glenn Allison, male    DOB: 06-28-1955, 62 y.o.   MRN: [id number]  Synopsis: First seen in 2018 for severe COPD, needed supplemental oxygen in 2019.  Noted to have multiple pulmonary nodules in 2019.  HPI Chief Complaint  Patient presents with  . Follow-up   This has been a reasonably stable interval for Glenn Allison since the last visit.  He did have one exacerbation and received prednisone and antibiotics and he has been feeling better since then.  He still has a cough from time to time and does feel congestion and ends up using his albuterol nebulizer about twice per week.  He continues to take Trelegy 1 puff daily no matter how he feels.  He has been using his oxygen more consistently.  He does not use it at rest but he wears it when he walks around at 3 L/min.  He also sleeps with it in the evenings.  He says that he has night sweats from time to time but he does not have problems with weight loss.  He has not had fevers during the daytime.  He is supposed to have a CT scan of his chest later this week.    Past Medical History:  Diagnosis Date  . COPD (chronic obstructive pulmonary disease) (HCC) 06/01/2016   Greater than 50-pack-year smoking history  . Hyperlipidemia   . Hypertension       Review of Systems  Constitutional: Negative for chills, fatigue and fever.  HENT: Negative for nosebleeds, postnasal drip and rhinorrhea.   Respiratory: Positive for shortness of breath. Negative for choking and wheezing.   Cardiovascular: Negative for chest pain, palpitations and leg swelling.       Objective:   Physical Exam Vitals:   02/10/18 1350 02/10/18 1404  BP: (!) 142/90   Pulse: 86   SpO2: (!) 75% 95%  Weight: 188 lb 12.8 oz (85.6 kg)   Height: 5' 11.06" (1.805 m)    RA (on 3L His O2 was 95%)  Gen: well appearing HENT: OP clear, TM's clear, neck supple PULM: Poor air movement B, normal percussion CV: RRR, no mgr, trace edema GI: BS+, soft,  nontender Derm: no cyanosis or rash Psyche: normal mood and affect   Heart Cath: May 2018  90% small first diagonal. The obstructions in the proximal one third of the vessel. Diameter is less than 2 mm.  Widely patent left main, LAD, and ramus intermedius.  40% proximal to mid circumflex. First obtuse marginal with 50% narrowing.  Dominant right coronary with 30-40% mid vessel narrowing.  Normal LV function. End-diastolic pressure is normal.  PFT: April 2018 ratio 27%, FEV1 0.70 L 20% predicted, FVC 2.62 L 60% predicted, total lung capacity 8.07 L 109% predicted, residual volume 243% predicted, DLCO 13.1 238% predicted  Chest imaging: April April 2018 chest x-ray images independently reviewed showing hyperinflation consistent with emphysema September 2018 lung cancer screening CT scan showed emphysema, RADS 3 08/2017 Low dose CT screening: saber trachea, significant centrilobular emphysema, RUL 9mm nodule, 10.619mm LUL nodule, RML and bilateral LL bronchiectasis; radiology notes that 2 other nodules have resolved November 15, 2017 CT chest showed lung RADS for a suspicious, needs continued follow-up: Right lower lobe 6.5 mm nodule is new, other nodules (right lung base 13.4 mm nodule decreased from 13.8 and left upper lobe 8.2 mm, decreased from 10.5 mm) have both decreased in size.  The radiologist indicated that they suspected an inflammatory cause for  these nodules.   Micro: No recent results       Assessment & Plan:   OSA (obstructive sleep apnea)  Centrilobular emphysema (HCC)  Pulmonary nodules  Chronic respiratory failure, unsp w hypoxia or hypercapnia (HCC)  Discussion: Glenn Allison has severe COPD and severe shortness of breath related to the same.  It sounds as if he is not exercising consistently so we talked about the importance of that at length today.  He had one exacerbation since the last visit.  I think his waxing and waning nodules are likely inflammatory as  discussed previously.  Fortunately he has not been producing mucus lately so I doubt he has a chronic infection like MAI.  Plan: Waxing and waning pulmonary nodules, multiple: Repeat CT scan of the chest later this week, we will call you with the results  Severe COPD: Continue Trelegy 1 puff daily Stay active, exercise regularly Continue to use albuterol as needed for chest tightness wheezing or shortness of breath If you decide to have a flu shot please let us know Practice good hand hygiene  Severe shortness of breath: Exercise consistently on a day-to-day basis I recommend that she do resistance training at least twice per week  Chronic respiratory failure with hypoxemia: We will check your oxygen today with a portable oxygen concentrator and if you qualify we will prescribe one for you to use at 3 L/min Room air at rest  We will see you back in 3 months or sooner if needed  > 50% of this 25 min visit spent face to face   Current Outpatient Medications:  .  albuterol (PROAIR HFA) 108 (90 Base) MCG/ACT inhaler, Inhale 1-2 puffs into the lungs every 6 (six) hours as needed for wheezing or shortness of breath., Disp: 1 Inhaler, Rfl: 5 .  albuterol (PROVENTIL) (2.5 MG/3ML) 0.083% nebulizer solution, Take 3 mLs (2.5 mg total) by nebulization every 6 (six) hours as needed for wheezing or shortness of breath. Dx:j44.9, Disp: 75 mL, Rfl: 12 .  aspirin EC 81 MG tablet, Take 1 tablet (81 mg total) by mouth daily., Disp: , Rfl:  .  citalopram (CELEXA) 20 MG tablet, TAKE 1 TABLET BY MOUTH DAILY (Patient taking differently: TAKE 0.5 TABLET BY MOUTH DAILY), Disp: 30 tablet, Rfl: 10 .  clonazePAM (KLONOPIN) 0.5 MG tablet, Take 1 tablet (0.5 mg total) by mouth 2 (two) times daily as needed for anxiety., Disp: 20 tablet, Rfl: 0 .  ezetimibe (ZETIA) 10 MG tablet, Take 1 tablet (10 mg total) by mouth daily., Disp: 90 tablet, Rfl: 3 .  Fluticasone-Umeclidin-Vilant (TRELEGY ELLIPTA) 100-62.5-25  MCG/INH AEPB, Inhale 1 puff into the lungs daily., Disp: 1 each, Rfl: 5 .  hydrochlorothiazide (MICROZIDE) 12.5 MG capsule, TAKE ONE CAPSULE BY MOUTH EVERY DAY, Disp: 90 capsule, Rfl: 2 .  metoprolol succinate (TOPROL-XL) 100 MG 24 hr tablet, TAKE 1 TABLET (100 MG TOTAL) BY MOUTH DAILY. TAKE WITH OR IMMEDIATELY FOLLOWING A MEAL., Disp: 90 tablet, Rfl: 3 .  nitroGLYCERIN (NITROSTAT) 0.4 MG SL tablet, 1 tab sublingual for significant dyspnea, may repeat every 5 minutes x2 if not relieved, Disp: 25 tablet, Rfl: 1 .  REXULTI 0.25 MG TABS, Take 1 tablet by mouth every morning. Every other morning, Disp: , Rfl: 1 .  valsartan (DIOVAN) 80 MG tablet, TAKE 1 TABLET BY MOUTH EVERY DAY, Disp: 90 tablet, Rfl: 3 .  atorvastatin (LIPITOR) 40 MG tablet, Take 1 tablet (40 mg total) by mouth daily., Disp: 90 tablet, Rfl: 3

## 2018-02-24 ENCOUNTER — Telehealth: Payer: Self-pay | Admitting: Acute Care

## 2018-02-24 NOTE — Telephone Encounter (Signed)
Please call patient and let them  know their  low dose Ct was read as a Lung  RADS 3, nodules that are probably benign findings, short term follow up suggested: includes nodules with a low likelihood of becoming a clinically active cancer. Radiology recommends a 6 month repeat LDCT follow up..Please let them  know we will order and schedule their 6 month follow up  screening scan for 08/2018. Please let them  know there was notation of CAD on their  scan.  Please remind the patient  that this is a non-gated exam therefore degree or severity of disease  cannot be determined. Please have them  follow up with their PCP regarding potential risk factor modification, dietary therapy or pharmacologic therapy if clinically indicated. Pt.  is  currently on statin therapy. Please place order for annual  screening scan for  08/2018 and fax results to PCP. Thanks so much.

## 2018-02-28 NOTE — Telephone Encounter (Signed)
Pt informed of CT results per Kandice RobinsonsSarah Groce, NP.  PT verbalized understanding.  Copy sent to PCP.  Order will be  placed for 6 mth  f/u CT [facility]. Nothing further needed.

## 2018-03-03 ENCOUNTER — Telehealth: Payer: Self-pay | Admitting: Acute Care

## 2018-03-03 NOTE — Telephone Encounter (Signed)
Please call patient and let them  know their  low dose Ct was read as a Lung  RADS 3, nodules that are probably benign findings, short term follow up suggested: includes nodules with a low likelihood of becoming a clinically active cancer. Radiology recommends a 6 month repeat LDCT follow up..Please let them  know we will order and schedule their  6 month follow up  screening scan for 08/2018. Please let them  know there was notation of CAD on their  scan.  Please remind the patient  that this is a non-gated exam therefore degree or severity of disease  cannot be determined. Please have them  follow up with their PCP regarding potential risk factor modification, dietary therapy or pharmacologic therapy if clinically indicated. Pt.  is  currently on statin therapy. Please place order for 6 month follow up  screening scan for  6.2020 and fax results to PCP. Thanks so much.

## 2018-03-03 NOTE — Telephone Encounter (Signed)
See telephone note 02/24/18 

## 2018-03-07 ENCOUNTER — Other Ambulatory Visit: Payer: Self-pay | Admitting: Interventional Cardiology

## 2018-03-10 ENCOUNTER — Other Ambulatory Visit: Payer: Self-pay | Admitting: Cardiology

## 2018-03-23 IMAGING — DX DG CHEST 2V
2 series · 2 of 2 positions shown · non-contrast
Comparison: None.

CLINICAL DATA: Dyspnea on exertion.  Smoker.  COPD

EXAM:
CHEST  2 VIEW

[chest pa]
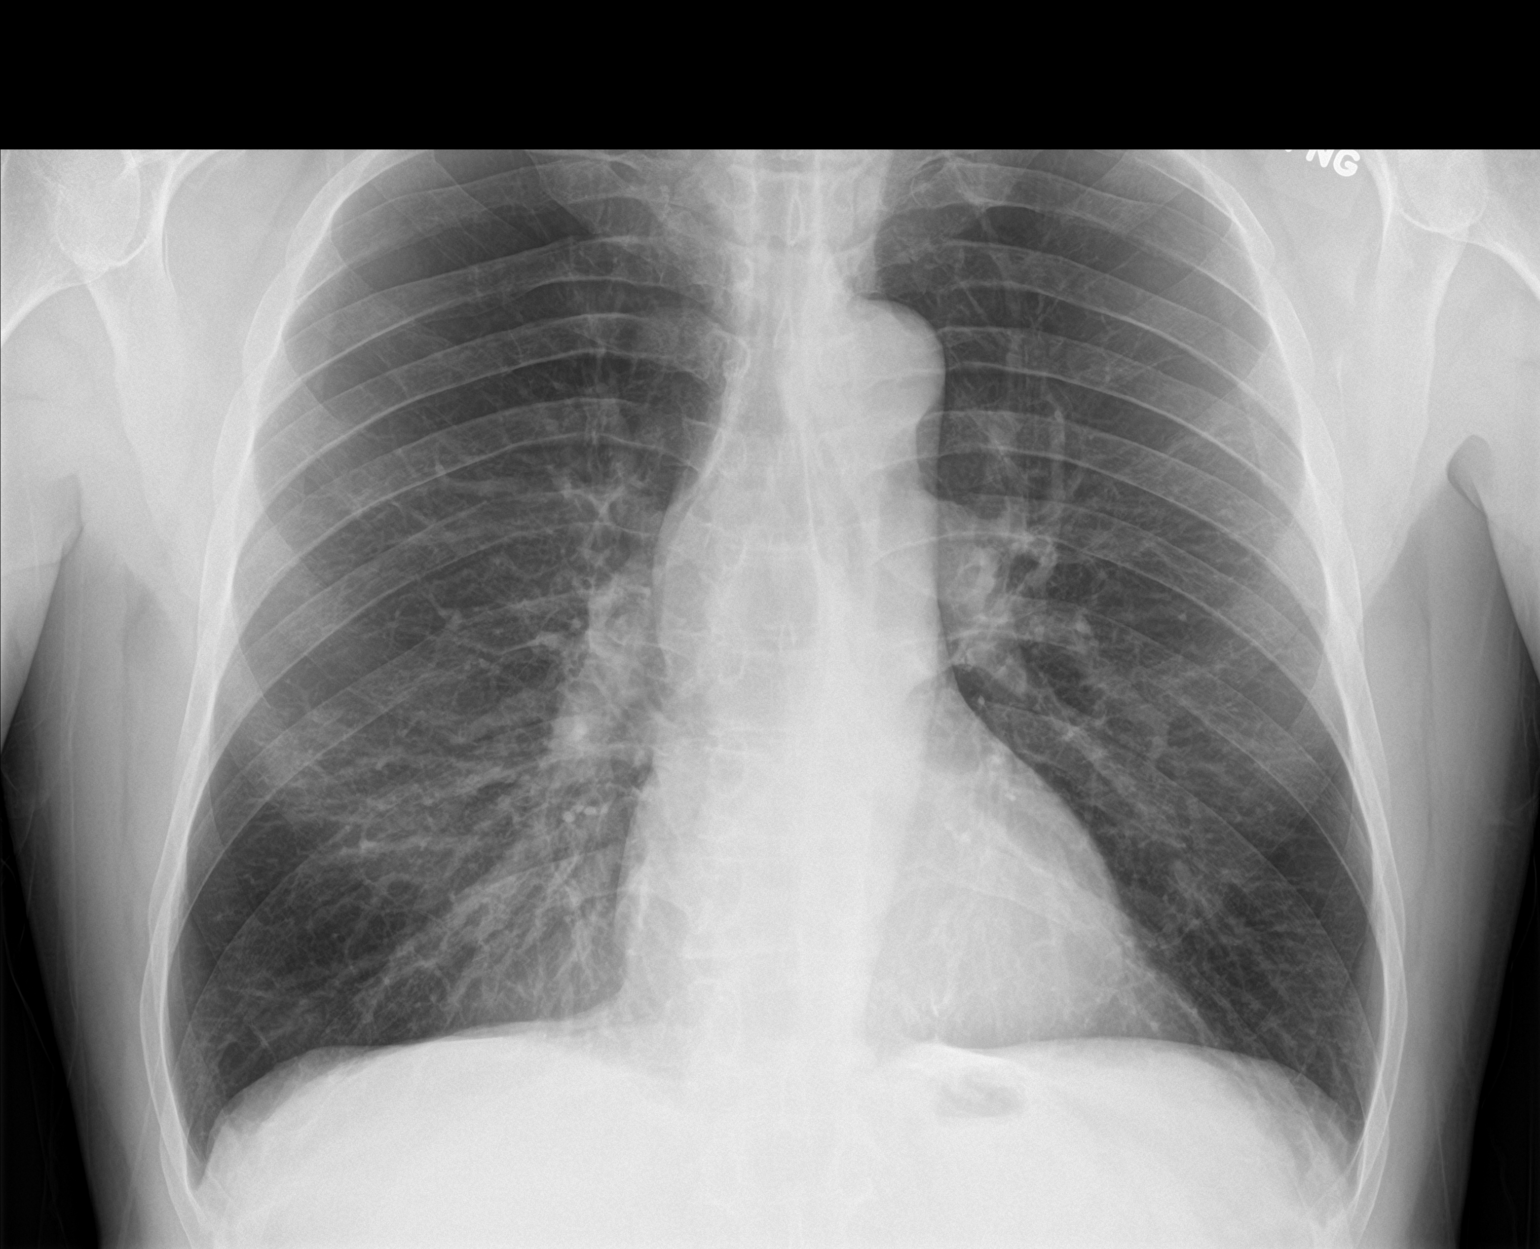

[chest lat]
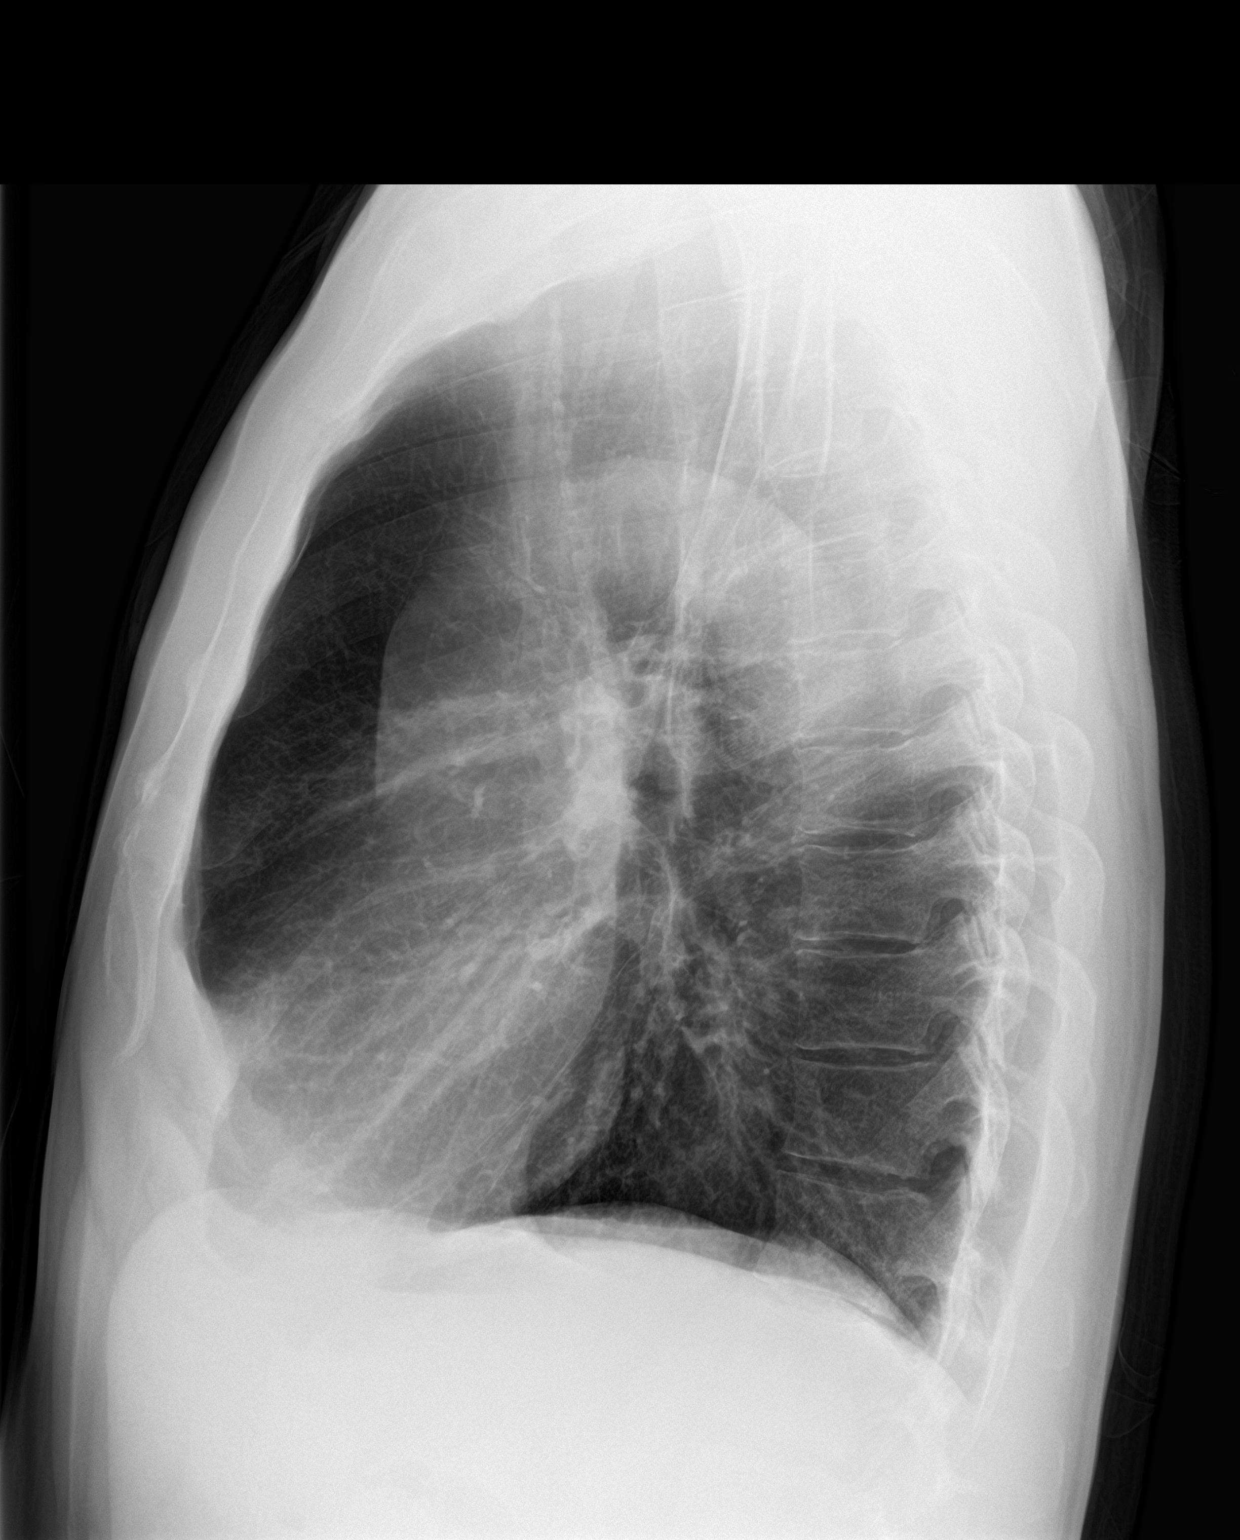

[2 of 2 positions shown; findings below may reference images not displayed]

FINDINGS: Pulmonary hyperinflation with changes of COPD. Lungs are clear
without infiltrate effusion or mass. Negative for heart failure.
Heart size within normal limits.
IMPRESSION: COPD without acute abnormality.

## 2018-03-24 ENCOUNTER — Telehealth: Payer: Self-pay | Admitting: Pulmonary Disease

## 2018-03-24 NOTE — Telephone Encounter (Signed)
Spoke with the pt and notified ok to be around his gf with PNA as long as she is not running a fever and is taking her abx  I advised him to practice good hand hygiene  Nothing further needed

## 2018-04-18 ENCOUNTER — Ambulatory Visit (INDEPENDENT_AMBULATORY_CARE_PROVIDER_SITE_OTHER)
Admission: RE | Admit: 2018-04-18 | Discharge: 2018-04-18 | Disposition: A | Discharge status: Home / Self Care | Payer: Medicaid Other | Source: Ambulatory Visit | Attending: Pulmonary Disease | Admitting: Pulmonary Disease

## 2018-04-18 ENCOUNTER — Ambulatory Visit (INDEPENDENT_AMBULATORY_CARE_PROVIDER_SITE_OTHER): Payer: Medicaid Other | Admitting: Pulmonary Disease

## 2018-04-18 ENCOUNTER — Encounter: Payer: Self-pay | Admitting: Pulmonary Disease

## 2018-04-18 ENCOUNTER — Telehealth: Payer: Self-pay | Admitting: Pulmonary Disease

## 2018-04-18 VITALS — BP 110/70 | HR 74 | Ht 71.0 in | Wt 191.0 lb

## 2018-04-18 DIAGNOSIS — G4734 Idiopathic sleep related nonobstructive alveolar hypoventilation: Secondary | ICD-10-CM

## 2018-04-18 DIAGNOSIS — R05 Cough: Secondary | ICD-10-CM | POA: Diagnosis not present

## 2018-04-18 DIAGNOSIS — R059 Cough, unspecified: Secondary | ICD-10-CM

## 2018-04-18 MED ORDER — DOXYCYCLINE HYCLATE 100 MG PO TABS: 100.0000 mg | ORAL_TABLET | Freq: Two times a day (BID) | ORAL | 0 refills | Status: DC

## 2018-04-18 MED ORDER — LEVOFLOXACIN 500 MG PO TABS: 500.0000 mg | ORAL_TABLET | Freq: Every day | ORAL | 0 refills | Status: DC

## 2018-04-18 MED ORDER — PREDNISONE 10 MG PO TABS: 10.0000 mg | ORAL_TABLET | Freq: Two times a day (BID) | ORAL | 0 refills | Status: DC

## 2018-04-18 NOTE — Telephone Encounter (Signed)
Call patient  CXR does not show any collapsed lung segment  Consistent with bronchitis -Appropriate to just use antibiotics and the steroids  Call us as we discussed in about a week or sooner

## 2018-04-18 NOTE — Telephone Encounter (Signed)
Patient is now aware of results nothing further needed at this time.

## 2018-04-18 NOTE — Patient Instructions (Addendum)
Recent aspiration event Increasing cough with mucus production  We will give you a course of Levaquin 500 daily for 7 days Prednisone prescription 10 p.o. twice daily-use 10 p.o. twice daily for 3 days then cut down to 10 daily for about 3 to 4 days and stop if you are feeling better already  We will obtain a chest x-ray We will obtain an overnight oximetry with your CPAP on  We will encourage you to use Klonopin as needed An alternative will be melatonin-5 mg p.o. nightly  You can give [place] a call a few days into using antibiotics let us know how you are doing  Keep your appointment with Dr. Faythe Dingwall will be glad to see you if you are still having any significant problems

## 2018-04-18 NOTE — Telephone Encounter (Signed)
Spoke with Patients friend Clydie Braun. They were at pharmacy to pick up prescription, and were told Levaquin would interact with other medications, they recommended Doxycycline.   Per Dr Wynona Neat- ok for Doxycycline 100mg , BID, for 10 days.  Prescription sent to pharmacy. Patient is aware.  Nothing further at this time.

## 2018-04-18 NOTE — Progress Notes (Signed)
Glenn Allison    [id number]    30-Mar-1955  Primary Care Physician:Brown, Mickie Hillier  Referring Physician: Claybon Jabs, PA-C [address] [place], [place] [address]  Chief complaint:   Patient of Dr. Ulyses Jarred with a recent aspiration event Concerned about his breathing at night  HPI:  His girlfriend noticed some gurgling in his breathing during his sleep-he did not have a CPAP on He had recently had what appeared to be an aspiration event Had gone out for a meal, appeared to choke on something was able to cough and clear his airway Was still coughing for a few days after that and on the next day appear to have brought up some food material, his cough is still more pronounced than previously with increased secretions-described as greenish He is not having any fevers or chills  Uses oxygen supplementation during the day  Was recently started on CPAP therapy about November 2019-has been compliant  Has a history of PTSD Usually does not like using medications  Usually functions well  Outpatient Encounter Medications as of 04/18/2018  Medication Sig  . albuterol (PROAIR HFA) 108 (90 Base) MCG/ACT inhaler Inhale 1-2 puffs into the lungs every 6 (six) hours as needed for wheezing or shortness of breath.  Marland Kitchen albuterol (PROVENTIL) (2.5 MG/3ML) 0.083% nebulizer solution Take 3 mLs (2.5 mg total) by nebulization every 6 (six) hours as needed for wheezing or shortness of breath. Dx:j44.9  . aspirin EC 81 MG tablet Take 1 tablet (81 mg total) by mouth daily.  Marland Kitchen atorvastatin (LIPITOR) 40 MG tablet TAKE 1 TABLET BY MOUTH EVERY DAY  . citalopram (CELEXA) 20 MG tablet TAKE 1 TABLET BY MOUTH DAILY (Patient taking differently: TAKE 0.5 TABLET BY MOUTH DAILY)  . clonazePAM (KLONOPIN) 0.5 MG tablet Take 1 tablet (0.5 mg total) by mouth 2 (two) times daily as needed for anxiety.  Marland Kitchen ezetimibe (ZETIA) 10 MG tablet Take 1 tablet (10 mg total) by mouth daily.  .  Fluticasone-Umeclidin-Vilant (TRELEGY ELLIPTA) 100-62.5-25 MCG/INH AEPB Inhale 1 puff into the lungs daily.  . hydrochlorothiazide (MICROZIDE) 12.5 MG capsule TAKE ONE CAPSULE BY MOUTH EVERY DAY  . metoprolol succinate (TOPROL-XL) 100 MG 24 hr tablet TAKE 1 TABLET (100 MG TOTAL) BY MOUTH DAILY. TAKE WITH OR IMMEDIATELY FOLLOWING A MEAL.  . nitroGLYCERIN (NITROSTAT) 0.4 MG SL tablet 1 tab sublingual for significant dyspnea, may repeat every 5 minutes x2 if not relieved  . REXULTI 0.25 MG TABS Take 1 tablet by mouth every morning. Every other morning  . valsartan (DIOVAN) 80 MG tablet TAKE 1 TABLET BY MOUTH EVERY DAY   No facility-administered encounter medications on file as of 04/18/2018.     Allergies as of 04/18/2018  . (No Known Allergies)    Past Medical History:  Diagnosis Date  . COPD (chronic obstructive pulmonary disease) (HCC) 06/01/2016   Greater than 50-pack-year smoking history  . Hyperlipidemia   . Hypertension     Past Surgical History:  Procedure Laterality Date  . LEFT HEART CATH AND CORONARY ANGIOGRAPHY N/A 07/24/2016   Procedure: Left Heart Cath and Coronary Angiography;  Surgeon: Lyn Records, MD;  Location: [facility] INVASIVE CV LAB;  Service: Cardiovascular;  Laterality: N/A;  . None      Family History  Problem Relation Age of Onset  . Heart disease Mother        2 stents  . Diabetes Mother   . Hyperlipidemia Mother   . Hypertension  Mother   . Alcohol abuse Father   . Hyperlipidemia Brother     Social History   Socioeconomic History  . Marital status: Single    Spouse name: Not on file  . Number of children: 0  . Years of education: GTI  . Highest education level: Not on file  Occupational History  . 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 Financial resource strain: Not on file  . Food insecurity:    Worry: Not on file    Inability: Not on file  . Transportation needs:    Medical: Not on file     Non-medical: Not on file  Tobacco Use  . Smoking status: Former Smoker    Packs/day: 1.50    Years: 40.00    Pack years: 60.00    Types: Cigarettes    Start date: 12/01/1972    Last attempt to quit: 03/12/2012    Years since quitting: 6.1  . Smokeless tobacco: Never Used  Substance and Sexual Activity  . Alcohol use: Yes    Alcohol/week: 1.0 standard drinks    Types: 1 Standard drinks or equivalent per week    Comment: socially  . Drug use: No  . Sexual activity: Yes    Partners: Female  Lifestyle  . Physical activity:    Days per week: Not on file    Minutes per session: Not on file  . Stress: Not on file  Relationships  . Social connections:    Talks on phone: Not on file    Gets together: Not on file    Attends religious service: Not on file    Active member of club or organization: Not on file    Attends meetings of clubs or organizations: Not on file    Relationship status: Not on file  . Intimate partner violence:    Fear of current or ex partner: Not on file    Emotionally abused: Not on file    Physically abused: Not on file    Forced sexual activity: Not on file  Other Topics Concern  . 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   [place] and graduated from [employer].   Made living in [place].   Cared for his mother for 10 years and has ignored his own health.   Not seen by a doctor for 16 years.    Review of Systems  Constitutional: Positive for fatigue. Negative for fever.  HENT: Negative.   Eyes: Negative.   Respiratory: Positive for cough and shortness of breath.   Cardiovascular: Negative.   Gastrointestinal: Positive for diarrhea.    Vitals:   04/18/18 1415  BP: 110/70  Pulse: 74  SpO2: 90%     Physical Exam  Constitutional: He is oriented to person, place, and time. He appears well-developed and well-nourished.  HENT:  Head: Normocephalic and  atraumatic.  Eyes: Pupils are equal, round, and reactive to light. Conjunctivae and EOM are normal. Right eye exhibits no discharge. Left eye exhibits no discharge.  Neck: Normal range of motion. Neck supple. No tracheal deviation present. No thyromegaly present.  Cardiovascular: Normal rate and regular rhythm.  Pulmonary/Chest: Effort normal and breath sounds normal. No respiratory distress. He has no wheezes. He has no rales. He exhibits no tenderness.  Abdominal: Soft. Bowel sounds are normal. He exhibits  no distension. There is no abdominal tenderness. There is no rebound.  Musculoskeletal: Normal range of motion.        General: No edema.  Neurological: He is alert and oriented to person, place, and time. No cranial nerve deficit.   Data Reviewed: Recent CT report from 03/18/2018 reviewed Compliance report noted-has not been using CPAP the last few days secondary to his coughing and shortness of breath-was away from home Most recent PFT from 2018 revealed very severe obstructive disease  Assessment:   Possible aspiration event  Obstructive lung disease  Obstructive sleep apnea-compliant with CPAP use  Plan/Recommendations:  Continue CPAP use -Encouraged to continue CPAP  We will get oximetry while on CPAP to ascertain adequate oxygenation at night  We will give him a course of Levaquin 500 p.o. daily for 7 days Short course of steroids  Follow-up on a chest x-ray  He will give [place] a call in about a week to let us know how he is doing  Advised to keep his appointment with Dr. Noralyn Pick MD [facility] 04/18/2018, 2:16 PM  CC: Claybon Jabs, PA-C

## 2018-04-28 NOTE — Progress Notes (Signed)
Thanks

## 2018-05-04 ENCOUNTER — Other Ambulatory Visit: Payer: Self-pay | Admitting: Pulmonary Disease

## 2018-05-09 ENCOUNTER — Other Ambulatory Visit: Payer: Self-pay | Admitting: Pulmonary Disease

## 2018-05-12 ENCOUNTER — Encounter: Payer: Self-pay | Admitting: Pulmonary Disease

## 2018-05-12 ENCOUNTER — Ambulatory Visit (INDEPENDENT_AMBULATORY_CARE_PROVIDER_SITE_OTHER): Payer: Medicaid Other | Admitting: Pulmonary Disease

## 2018-05-12 VITALS — BP 118/82 | HR 63 | Ht 71.0 in | Wt 194.4 lb

## 2018-05-12 DIAGNOSIS — G4734 Idiopathic sleep related nonobstructive alveolar hypoventilation: Secondary | ICD-10-CM

## 2018-05-12 DIAGNOSIS — J432 Centrilobular emphysema: Secondary | ICD-10-CM | POA: Diagnosis not present

## 2018-05-12 DIAGNOSIS — R918 Other nonspecific abnormal finding of lung field: Secondary | ICD-10-CM

## 2018-05-12 DIAGNOSIS — R059 Cough, unspecified: Secondary | ICD-10-CM

## 2018-05-12 DIAGNOSIS — R05 Cough: Secondary | ICD-10-CM

## 2018-05-12 DIAGNOSIS — G4733 Obstructive sleep apnea (adult) (pediatric): Secondary | ICD-10-CM | POA: Diagnosis not present

## 2018-05-12 NOTE — Patient Instructions (Signed)
COPD: Continue Trelegy 1 puff daily no matter how you feel Practice good hand hygiene Stay active Use albuterol as needed for chest tightness wheezing or shortness of breath  Chronic respiratory failure with hypoxemia: Continue to use 3 L of oxygen with exertion  Waxing and waning pulmonary nodules: Repeat lung screening CT scan in May or June of this year  We will see you back in June 2020 or sooner if needed

## 2018-05-12 NOTE — Progress Notes (Signed)
Subjective:    Patient ID: Glenn Allison, male    DOB: 10/04/1955, 63 y.o.   MRN: [id number]  Synopsis: First seen in 2018 for severe COPD, needed supplemental oxygen in 2019.  Noted to have multiple pulmonary nodules in 2019.  HPI Chief Complaint  Patient presents with  . Follow-up    Per patient, F/U for bronchitis. States his breathing has been ok since last visit. Saw AO on 04/18/18.   Glenn Allison is feeling better since he took the levaquin and prednisone.  He is still "listless".  He hasn't started exercising again.  He is still taking Trelegy daily.   He is still using and benefiting from his oxygen every day.  He says that his breathing is back to baseline.  Past Medical History:  Diagnosis Date  . COPD (chronic obstructive pulmonary disease) (HCC) 06/01/2016   Greater than 50-pack-year smoking history  . Hyperlipidemia   . Hypertension       Review of Systems  Constitutional: Negative for chills, fatigue and fever.  HENT: Negative for nosebleeds, postnasal drip and rhinorrhea.   Respiratory: Positive for shortness of breath. Negative for choking and wheezing.   Cardiovascular: Negative for chest pain, palpitations and leg swelling.       Objective:   Physical Exam Vitals:   05/12/18 1046  BP: 118/82  Pulse: 63  SpO2: 94%  Weight: 194 lb 6.4 oz (88.2 kg)  Height: 5\' 11"  (1.803 m)   3L [place]  Gen: well appearing HENT: OP clear, TM's clear, neck supple PULM: CTA B, normal percussion CV: RRR, no mgr, trace edema GI: BS+, soft, nontender Derm: no cyanosis or rash Psyche: normal mood and affect    Heart Cath: May 2018  90% small first diagonal. The obstructions in the proximal one third of the vessel. Diameter is less than 2 mm.  Widely patent left main, LAD, and ramus intermedius.  40% proximal to mid circumflex. First obtuse marginal with 50% narrowing.  Dominant right coronary with 30-40% mid vessel narrowing.  Normal LV function. End-diastolic pressure  is normal.  PFT: April 2018 ratio 27%, FEV1 0.70 L 20% predicted, FVC 2.62 L 60% predicted, total lung capacity 8.07 L 109% predicted, residual volume 243% predicted, DLCO 13.1 238% predicted  Chest imaging: April April 2018 chest x-ray images independently reviewed showing hyperinflation consistent with emphysema September 2018 lung cancer screening CT scan showed emphysema, RADS 3 08/2017 Low dose CT screening: saber trachea, significant centrilobular emphysema, RUL 66mm nodule, 10.19mm LUL nodule, RML and bilateral LL bronchiectasis; radiology notes that 2 other nodules have resolved November 15, 2017 CT chest showed lung RADS for a suspicious, needs continued follow-up: Right lower lobe 6.5 mm nodule is new, other nodules (right lung base 13.4 mm nodule decreased from 13.8 and left upper lobe 8.2 mm, decreased from 10.5 mm) have both decreased in size.  The radiologist indicated that they suspected an inflammatory cause for these nodules. 02/2018 Lung screening CT RADS 3, stable nodules, recommend f/u in 6 months  Micro: No recent results       Assessment & Plan:   Nocturnal hypoxemia  Cough  OSA (obstructive sleep apnea)  Centrilobular emphysema (HCC)  Pulmonary nodules  Discussion: This has been a stable interval for [facility].  He has recovered from his exacerbation of COPD treated with Levaquin and prednisone by my partner.  We talked about the importance of hand hygiene this time a year.  Plan: COPD: Continue Trelegy 1 puff daily no matter  how you feel Practice good hand hygiene Stay active Use albuterol as needed for chest tightness wheezing or shortness of breath  Chronic respiratory failure with hypoxemia: Continue to use 3 L of oxygen with exertion  Waxing and waning pulmonary nodules: Repeat lung screening CT scan in May or June of this year  We will see you back in June 2020 or sooner if needed   Current Outpatient Medications:  .  albuterol (PROAIR HFA) 108  (90 Base) MCG/ACT inhaler, Inhale 1-2 puffs into the lungs every 6 (six) hours as needed for wheezing or shortness of breath., Disp: 1 Inhaler, Rfl: 5 .  albuterol (PROVENTIL) (2.5 MG/3ML) 0.083% nebulizer solution, PLEASE SEE ATTACHED FOR DETAILED DIRECTIONS, Disp: 75 mL, Rfl: 12 .  aspirin EC 81 MG tablet, Take 1 tablet (81 mg total) by mouth daily., Disp: , Rfl:  .  atorvastatin (LIPITOR) 40 MG tablet, TAKE 1 TABLET BY MOUTH EVERY DAY, Disp: 30 tablet, Rfl: 6 .  citalopram (CELEXA) 20 MG tablet, TAKE 1 TABLET BY MOUTH DAILY (Patient taking differently: TAKE 0.5 TABLET BY MOUTH DAILY), Disp: 30 tablet, Rfl: 10 .  clonazePAM (KLONOPIN) 0.5 MG tablet, Take 1 tablet (0.5 mg total) by mouth 2 (two) times daily as needed for anxiety., Disp: 20 tablet, Rfl: 0 .  ezetimibe (ZETIA) 10 MG tablet, Take 1 tablet (10 mg total) by mouth daily., Disp: 90 tablet, Rfl: 3 .  hydrochlorothiazide (MICROZIDE) 12.5 MG capsule, TAKE ONE CAPSULE BY MOUTH EVERY DAY, Disp: 90 capsule, Rfl: 1 .  metoprolol succinate (TOPROL-XL) 100 MG 24 hr tablet, TAKE 1 TABLET (100 MG TOTAL) BY MOUTH DAILY. TAKE WITH OR IMMEDIATELY FOLLOWING A MEAL., Disp: 90 tablet, Rfl: 3 .  nitroGLYCERIN (NITROSTAT) 0.4 MG SL tablet, 1 tab sublingual for significant dyspnea, may repeat every 5 minutes x2 if not relieved, Disp: 25 tablet, Rfl: 1 .  REXULTI 0.25 MG TABS, Take 1 tablet by mouth every morning. Every other morning, Disp: , Rfl: 1 .  TRELEGY ELLIPTA 100-62.5-25 MCG/INH AEPB, TAKE 1 PUFF BY MOUTH EVERY DAY, Disp: 60 each, Rfl: 6 .  valsartan (DIOVAN) 80 MG tablet, TAKE 1 TABLET BY MOUTH EVERY DAY, Disp: 90 tablet, Rfl: 3

## 2018-06-12 ENCOUNTER — Telehealth: Payer: Self-pay | Admitting: Pulmonary Disease

## 2018-06-12 DIAGNOSIS — J9610 Chronic respiratory failure, unspecified whether with hypoxia or hypercapnia: Secondary | ICD-10-CM

## 2018-06-12 NOTE — Telephone Encounter (Signed)
Unsure if this would actually help the patient.  But if the patient would like to try a face mask instead of a nasal cannula for management of his oxygen that is fine.  Place the order for mask of choice to the patient's DME company.  The DME company can work with the patient to figure out which mask would be better for him.  Patient needs to utilize nasal saline twice daily for management of pollen as well as allergies.  Elisha Headland, FNP

## 2018-06-12 NOTE — Telephone Encounter (Signed)
Spoke with pt, he states Dr. Kendrick Fries encourages him to walk and he has been walking more but states the pollen is killing him. He spoke to someone at [facility] and they suggested a mask that would connect with his oxygen tubing. He needs an order to be sent to [employer] for this. Can we send the order?     Patient Instructions by Lupita Leash, MD at 05/12/2018 10:30 AM  Author: Lupita Leash, MD Author Type: Physician Filed: 05/12/2018 11:07 AM  Note Status: Signed Cosign: Cosign Not Required Encounter Date: 05/12/2018  Editor: Lupita Leash, MD (Physician)    COPD: Continue Trelegy 1 puff daily no matter how you feel Practice good hand hygiene Stay active Use albuterol as needed for chest tightness wheezing or shortness of breath  Chronic respiratory failure with hypoxemia: Continue to use 3 L of oxygen with exertion  Waxing and waning pulmonary nodules: Repeat lung screening CT scan in May or June of this year  We will see you back in June 2020 or sooner if needed

## 2018-06-12 NOTE — Telephone Encounter (Signed)
Called patient unable to reach LMTCB 

## 2018-06-13 NOTE — Telephone Encounter (Signed)
Spoke with patient. He stated that he is using nasal saline spray as well as taking Claritin 10mg  once daily. Advised him that I will send the order to [employer] today. He verbalized understanding. Nothing further needed at time of call.

## 2018-06-23 ENCOUNTER — Other Ambulatory Visit: Payer: Self-pay | Admitting: Interventional Cardiology

## 2018-06-26 ENCOUNTER — Telehealth: Payer: Self-pay | Admitting: Pulmonary Disease

## 2018-06-26 ENCOUNTER — Encounter: Payer: Self-pay | Admitting: Acute Care

## 2018-06-26 ENCOUNTER — Ambulatory Visit (INDEPENDENT_AMBULATORY_CARE_PROVIDER_SITE_OTHER): Payer: Medicaid Other | Admitting: Acute Care

## 2018-06-26 ENCOUNTER — Other Ambulatory Visit: Payer: Self-pay

## 2018-06-26 DIAGNOSIS — J441 Chronic obstructive pulmonary disease with (acute) exacerbation: Secondary | ICD-10-CM

## 2018-06-26 MED ORDER — PREDNISONE 10 MG PO TABS: ORAL_TABLET | ORAL | 0 refills | Status: DC

## 2018-06-26 MED ORDER — DOXYCYCLINE HYCLATE 100 MG PO TABS: 100.0000 mg | ORAL_TABLET | Freq: Two times a day (BID) | ORAL | 0 refills | Status: DC

## 2018-06-26 NOTE — Progress Notes (Signed)
Virtual Visit via Telephone Note  I connected with Glenn Allison on 06/26/18 at 11:00 AM EDT by telephone and verified that I am speaking with the correct person using two identifiers.   I discussed the limitations, risks, security and privacy concerns of performing an evaluation and management service by telephone and the availability of in person appointments. I also discussed with the patient that there may be a patient responsible charge related to this service. The patient expressed understanding and agreed to proceed.  I  confirmed date of birth and address to authenticate patient  Identity. My nurse Misty Stanley  reviewed medications and ordered any refills required.  Synopsis:  Glenn Allison is a 63 y.o. male former smoker ( 60 pack year smoking history, quit 2014) with severe COPD followed by Dr. Kendrick Fries since 2018. He uses 3 L oxygen with exertion.He wears CPAP for OSA. He is followed through the screening program for follow up of abnormal CT Chest..    History of Present Illness: Pt. Presents for a tele visit with complaints of not feeling well x 3-4 days . He did have bronchitis a few months ago. He states he feels the same way now.He has low energy and he is using his Mucinex. He has not had any sick contacts. He feels this is a sinus infection that has moved to his lungs.He states he does not have any fever. Increased mucus which is yellow in color.  He  Has been wheezing.He states it is more of an expiratory wheeze. He states he has been having to use his rescue inhaler up to 3 times daily for dyspnea.He did use his nebulizer at least once every day for the last 2 days. He states he has a cough but feels this is related to post nasal drip. He is not using Flonase at present.He denies fever, chest pain, orthopnea or hemostysis   Observations/Objective: Pulmonary Studies PFT;s 06/2016>> Severe Obstructive Airways Disease Response to bronchodilator Severe Diffusion Defect Airtrapping and  probable over-inflation Imaging /2018>> Lung RADS 3: Lung  RADS 3, nodules that are probably benign findings, short term follow up suggested: includes nodules with a low likelihood of becoming a clinically active cancer. Radiology recommends a 6 month repeat LDCT follow up. 08/2017>> Lung RADS 4 B indicates suspicious findings for which additional diagnostic testing and or tissue sampling is recommended. Per the impression, waxing and waning appearance favors a benign process possibly infection/ inflammation, metastatic disease not excluded. Seen by Dr. Kendrick Fries 11/2017>> Lung RADS 4 A : suspicious findings, either short term follow up in 3 months or alternatively  PET Scan evaluation may be considered when there is a solid component of  8 mm or larger.Again notation of waxing and waning appearance , infection, inflammation is favored 02/2018>> Lung Rads 3>> Lung  RADS 3, nodules that are probably benign findings, short term follow up suggested: includes nodules with a low likelihood of becoming a clinically active cancer. Radiology recommends a 6 month repeat LDCT follow up. 6 month follow up Left upper lobe nodule noted on prior study is now 8.2 mm, previously 10.5 mm Dominant nodule in the posterior right lung base grossly unchanged 13.4 mm previously 13.8 mm New increased 6.5 mm nodule superior segment right lower lobe  Assessment and Plan: COPD Flare We will send in Doxycycline 100 mg twice daily ( every 12 hours) for 7 days Prednisone taper; 10 mg tablets: 4 tabs x 2 days, 3 tabs x 2 days, 2 tabs x 2  days 1 tab x 2 days then stop. Continue taking your Trelegy 1 puff once daily. Rinse mouth after use Continue your albuterol inhaler as needed for shortness of breath or breakthrough wheezing. Follow up telephone visit in 1 week to make sure you are improving.  Consider adding a non-sedating anti histamine once daily for post nasal drip. ( Generic is ok) Claritin, Zyrtec, Xyzol Consider adding  Daliresp if continued flares  Abnormal CT Chest 02/2018>> LR 3 Plan Follow up CT through Lung Cancer Screening Program 07/2018  OSA on CPAP Continue on CPAP at bedtime.  Consider full face mask for CPAP machine at follow up Goal is to wear for at least 6 hours each night for maximal clinical benefit. Continue to work on weight loss, as the link between excess weight  and sleep apnea is well established.  Do not drive if sleepy. Remember to clean mask, tubing, filter, and reservoir once weekly with soapy water.  Please contact office for sooner follow up if symptoms do not improve or worsen or seek emergency care   Exertional Hypoxemia Continue wearing oxygen at 3 L  as needed for exertion. Maintain oxygen sats 88-92%  Corona Virus Continue to self isolate, wash hand frequently, and wear a face mask if you leave your home. Remember to utilize the early hours at grocery stores/ drug stores  for people > 424 years old or any underlying health risks, as the stores are cleaner and less crowded at these times.  July 03, 2018 at 9:30 am with Buelah ManisBeth Walsh to follow up and make sure you are improving. Follow up in July with Dr. Kendrick FriesMcQuaid    Follow Up Instructions: Follow up with Buelah ManisBeth Walsh NP 07/03/2018 at 09:30 Will need follow up with Dr. Kendrick FriesMcQuaid 09/2018 Will need CT Chest 07/2018 as follow up to LR3 >> Lung Cancer Screening Program   I discussed the assessment and treatment plan with the patient. The patient was provided an opportunity to ask questions and all were answered. The patient agreed with the plan and demonstrated an understanding of the instructions.   The patient was advised to call back or seek an in-person evaluation if the symptoms worsen or if the condition fails to improve as anticipated.  I provided 35 minutes of non-face-to-face time during this encounter.   Bevelyn NgoSarah F Xee Hollman, NP

## 2018-06-26 NOTE — Telephone Encounter (Signed)
Please make televisit for Glenn Allison

## 2018-06-26 NOTE — Patient Instructions (Addendum)
It is good to talk with you today. We will send in Doxycycline 100 mg twice daily ( every 12 hours) for 7 days Prednisone taper; 10 mg tablets: 4 tabs x 2 days, 3 tabs x 2 days, 2 tabs x 2 days 1 tab x 2 days then stop. Continue taking your Trelegy 1 puff once daily. Rinse mouth after use Continue your albuterol inhaler as needed for shortness of breath or breakthrough wheezing. Follow up telephone visit in 1 week to make sure you are improving.  Consider adding a non-sedating anti histamine once daily for post nasal drip. ( Generic is ok) Claritin, Zyrtec, Xyzol   Continue on CPAP at bedtime.  Consider full face mask for CPAP machine at follow up Goal is to wear for at least 6 hours each night for maximal clinical benefit. Continue to work on weight loss, as the link between excess weight  and sleep apnea is well established.  Do not drive if sleepy. Remember to clean mask, tubing, filter, and reservoir once weekly with soapy water.  Please contact office for sooner follow up if symptoms do not improve or worsen or seek emergency care   Continue wearing oxygen at 3 L [place] as needed for exertion. Maintain oxygen sats 88-92%  Continue to self isolate, wash hand frequently, and wear a face mask if you leave your home. Remember to utilize the early hours at grocery stores/ drug stores  for people > 70 years old or any underlying health risks, as the stores are cleaner and less crowded at these times.  July 03, 2018 at 9:30 am with Buelah Manis to follow up and make sure you are improving. Follow up in July with Dr. Kendrick Fries

## 2018-06-26 NOTE — Progress Notes (Signed)
Reviewed, agree 

## 2018-06-26 NOTE — Telephone Encounter (Signed)
Primary Pulmonologist: BQ Last office visit and with whom: 05/12/2018 with BQ What do we see them for (pulmonary problems): OSA/emphysema Last OV assessment/plan: Instructions   Return in about 3 months (around 08/12/2018).  COPD: Continue Trelegy 1 puff daily no matter how you feel Practice good hand hygiene Stay active Use albuterol as needed for chest tightness wheezing or shortness of breath  Chronic respiratory failure with hypoxemia: Continue to use 3 L of oxygen with exertion  Waxing and waning pulmonary nodules: Repeat lung screening CT scan in May or June of this year  We will see you back in June 2020 or sooner if needed       Was appointment offered to patient (explain)?  Pt requesting meds to be called in   Reason for call: called and spoke with pt. Pt has had complaints of a productive cough with yellow phlegm, chest congestion x2-3 days now. Due to the chest congestion, pt has had SOB which is worse than usual. Pt has tried mucinex and claritin to see if it would help with his symptoms. Pt is also taking his Trelegy daily as prescribed.  Pt has had to use his rescue inhaler twice daily and has used his nebulizer once daily since symptoms started. Pt denies any pain in sinus cavities. Pt stated he did have pain in the jaw area and in teeth earlier but that has now subsided.   Pt denies any fever or chills. Pt stated he has had some body aches but contributes that due to not being active recently. Pt denies any chest pain or chest tightness.  Pt is requesting to have something prescribed to help with his symptoms. Tammy, please advise recs for pt. Thanks!

## 2018-06-26 NOTE — Telephone Encounter (Signed)
Called and spoke with pt stating to him that we needed to get him scheduled for televisit. Pt expressed understanding. televisit scheduled with SG today at 11am. Nothing further needed.

## 2018-07-03 ENCOUNTER — Ambulatory Visit (INDEPENDENT_AMBULATORY_CARE_PROVIDER_SITE_OTHER): Payer: Medicaid Other | Admitting: Primary Care

## 2018-07-03 ENCOUNTER — Other Ambulatory Visit: Payer: Self-pay

## 2018-07-03 ENCOUNTER — Encounter: Payer: Self-pay | Admitting: Primary Care

## 2018-07-03 DIAGNOSIS — J441 Chronic obstructive pulmonary disease with (acute) exacerbation: Secondary | ICD-10-CM | POA: Diagnosis not present

## 2018-07-03 MED ORDER — PREDNISONE 10 MG PO TABS: ORAL_TABLET | ORAL | 0 refills | Status: DC

## 2018-07-03 NOTE — Patient Instructions (Addendum)
COPD exacerbation: - Extending prednisone taper (take 20mg  x 5 days; 10mg  x 5 days; stop) - Continue Mucinex twice daily for chest congestion  - Continue Claritin daily  - May consider adding Daliresp to therapy if continue to have frequent COPD flares ( more than 3+ a year)  CPAP cleaning device: - Clean Zone - available at [facility] for 79.88  Follow-up: - Dr. Kendrick Fries in July - Low dose CT chest in May

## 2018-07-03 NOTE — Progress Notes (Signed)
Virtual Visit via Telephone Note  I connected with Glenn Allison on 07/03/18 at  9:30 AM EDT by telephone and verified that I am speaking with the correct person using two identifiers.   I discussed the limitations, risks, security and privacy concerns of performing an evaluation and management service by telephone and the availability of in person appointments. I also discussed with the patient that there may be a patient responsible charge related to this service. The patient expressed understanding and agreed to proceed.   History of Present Illness: 63 year old male, former smoker. PMH significant for COPD, chronic respiratory failure with hypoxia, OSA, Lung RADS 3. Patient of Dr. Kendrick Fries, spoke with NP for televisit on 06/26/18. Treated for COPD exacerbation with doxycycline and prednisone. Due for LDCT with lung cancer screening program in May 2020.   07/03/2018 Patient called today for 1 week follow-up COPD exacerbation. Feels better, still a little congested but not as exhausted. He has been taking mucinex and Claritin as recommended. Continues to have some wheezing, not needing to use rescue inhaler as much but still requiring 2-3 times a day. Uses 3L oxygen on exertion. Sometimes feels he needs to using oxygen at rest. He does not have pulse oximeter to check oxygen level. Hasn't felt well enough to go out for his daily walk. Asking about cleaning devices for CPAP device, currently using vinegar solution. Denies fever.    Observations/Objective:  Observations: - No shortness of breath, wheezing or cough noted during conversation  Testing: PFTs April 2018- severe obstructive airway disease and diffusion defect  02/2018>> Lung Rads 3>> Lung  RADS 3, nodules that are probably benign findings, short term follow up suggested: includes nodules with a low likelihood of becoming a clinically active cancer. Radiology recommends a 6 month repeat LDCT follow up. 6 month follow up  Assessment and  Plan:  COPD exacerbation - Slowly improving - Extending prednisone taper (take 20mg  x 5 days; 10mg  x 5 days; stop) - Continue Mucinex twice daily for chest congestion  - Continue Claritin daily  - May consider adding Daliresp to therapy if continue to have frequent COPD flares (3+ a year)  OSA: - Look into cleaning device such as So Clean or Clean Zone  Follow Up Instructions:  - Dr. Kendrick Fries in July or sooner if symptoms worsen - Low dose CT chest in May   I discussed the assessment and treatment plan with the patient. The patient was provided an opportunity to ask questions and all were answered. The patient agreed with the plan and demonstrated an understanding of the instructions.   The patient was advised to call back or seek an in-person evaluation if the symptoms worsen or if the condition fails to improve as anticipated.  I provided 18 minutes of non-face-to-face time during this encounter.   Glenford Bayley, NP

## 2018-07-08 ENCOUNTER — Other Ambulatory Visit: Payer: Self-pay | Admitting: Interventional Cardiology

## 2018-07-09 ENCOUNTER — Telehealth: Payer: Self-pay | Admitting: Pulmonary Disease

## 2018-07-09 NOTE — Telephone Encounter (Signed)
PA request received from [facility] [address] requested: Trelegy Initiated PA via [employer] d/t patient's insurance. PA has been approved through 07/04/2019. PA#: [id number] Called [facility] to make aware of approval. Nothing further needed at this time- will close encounter.

## 2018-07-24 ENCOUNTER — Telehealth: Payer: Self-pay | Admitting: Pulmonary Disease

## 2018-07-24 NOTE — Telephone Encounter (Signed)
Pt scheduled for ov 08/01/18 with BW 02 recert COVID neg as of today. [facility] Nothing further needed.

## 2018-07-24 NOTE — Telephone Encounter (Signed)
Fayrene Fearing from [employer] sent me an email about this patient stating his 26 is on hold for reauthorization. The patient has had 2 virtual visits 4/16 4/23. He needs to have an actual office visit with qualifying walk or 6 minute walk. He needs the 02 test to re qualify for his 68

## 2018-08-01 ENCOUNTER — Other Ambulatory Visit: Payer: Self-pay

## 2018-08-01 ENCOUNTER — Encounter: Payer: Self-pay | Admitting: Primary Care

## 2018-08-01 ENCOUNTER — Ambulatory Visit (INDEPENDENT_AMBULATORY_CARE_PROVIDER_SITE_OTHER): Payer: Medicaid Other | Admitting: Primary Care

## 2018-08-01 VITALS — BP 110/70 | HR 84 | Temp 98.3°F | Ht 72.0 in | Wt 192.6 lb

## 2018-08-01 DIAGNOSIS — J449 Chronic obstructive pulmonary disease, unspecified: Secondary | ICD-10-CM | POA: Diagnosis not present

## 2018-08-01 DIAGNOSIS — R6889 Other general symptoms and signs: Secondary | ICD-10-CM | POA: Diagnosis not present

## 2018-08-01 DIAGNOSIS — G4733 Obstructive sleep apnea (adult) (pediatric): Secondary | ICD-10-CM

## 2018-08-01 DIAGNOSIS — R911 Solitary pulmonary nodule: Secondary | ICD-10-CM

## 2018-08-01 DIAGNOSIS — J9610 Chronic respiratory failure, unspecified whether with hypoxia or hypercapnia: Secondary | ICD-10-CM

## 2018-08-01 NOTE — Assessment & Plan Note (Signed)
-   Prior flu like symptoms 4-6 weeks ago - Check Covid serology

## 2018-08-01 NOTE — Patient Instructions (Addendum)
Serology COVID   Continue Trelegy 1 puff daily  Use Albuterol nebulizer over the next 1-2 weeks   Re-qualified for oxygen- use 3L on exertion   Start increasing your physical activity (Aim for 10min 2-3 times a week)   Follow up with Dr. Kendrick FriesMcQuaid in 3-4 months or sooner if needed   Due for low dose CT in MAY/JUNE  Needs full face mask through DME   COPD and Physical Activity Chronic obstructive pulmonary disease (COPD) is a long-term (chronic) condition that affects the lungs. COPD is a general term that can be used to describe many different lung problems that cause lung swelling (inflammation) and limit airflow, including chronic bronchitis and emphysema. The main symptom of COPD is shortness of breath, which makes it harder to do even simple tasks. This can also make it harder to exercise and be active. Talk with your health care provider about treatments to help you breathe better and actions you can take to prevent breathing problems during physical activity. What are the benefits of exercising with COPD? Exercising regularly is an important part of a healthy lifestyle. You can still exercise and do physical activities even though you have COPD. Exercise and physical activity improve your shortness of breath by increasing blood flow (circulation). This causes your heart to pump more oxygen through your body. Moderate exercise can improve your:  Oxygen use.  Energy level.  Shortness of breath.  Strength in your breathing muscles.  Heart health.  Sleep.  Self-esteem and feelings of self-worth.  Depression, stress, and anxiety levels. Exercise can benefit everyone with COPD. The severity of your disease may affect how hard you can exercise, especially at first, but everyone can benefit. Talk with your health care provider about how much exercise is safe for you, and which activities and exercises are safe for you. What actions can I take to prevent breathing problems during  physical activity?  Sign up for a pulmonary rehabilitation program. This type of program may include: ? Education about lung diseases. ? Exercise classes that teach you how to exercise and be more active while improving your breathing. This usually involves:  Exercise using your lower extremities, such as a stationary bicycle.  About 30 minutes of exercise, 2 to 5 times per week, for 6 to 12 weeks  Strength training, such as push ups or leg lifts. ? Nutrition education. ? Group classes in which you can talk with others who also have COPD and learn ways to manage stress.  If you use an oxygen tank, you should use it while you exercise. Work with your health care provider to adjust your oxygen for your physical activity. Your resting flow rate is different from your flow rate during physical activity.  While you are exercising: ? Take slow breaths. ? Pace yourself and do not try to go too fast. ? Purse your lips while breathing out. Pursing your lips is similar to a kissing or whistling position. ? If doing exercise that uses a quick burst of effort, such as weight lifting:  Breathe in before starting the exercise.  Breathe out during the hardest part of the exercise (such as raising the weights). Where to find support You can find support for exercising with COPD from:  Your health care provider.  A pulmonary rehabilitation program.  Your local health department or community health programs.  Support groups, online or in-person. Your health care provider may be able to recommend support groups. Where to find more information You can  find more information about exercising with COPD from:  American Lung Association: [place].hu.  COPD Foundation: AlmostHot.gl. Contact a health care provider if:  Your symptoms get worse.  You have chest pain.  You have nausea.  You have a fever.  You have trouble talking or catching your breath.  You want to start a new exercise  program or a new activity. Summary  COPD is a general term that can be used to describe many different lung problems that cause lung swelling (inflammation) and limit airflow. This includes chronic bronchitis and emphysema.  Exercise and physical activity improve your shortness of breath by increasing blood flow (circulation). This causes your heart to provide more oxygen to your body.  Contact your health care provider before starting any exercise program or new activity. Ask your health care provider what exercises and activities are safe for you. This information is not intended to replace advice given to you by your health care provider. Make sure you discuss any questions you have with your health care provider. Document Released: 03/21/2017 Document Revised: 03/21/2017 Document Reviewed: 03/21/2017 Elsevier Interactive Patient Education  2019 [employer].

## 2018-08-01 NOTE — Addendum Note (Signed)
Addended by: Maurene Capes on: 08/01/2018 01:39 PM   Modules accepted: Orders

## 2018-08-01 NOTE — Assessment & Plan Note (Signed)
-   Re-qualified for oxygen today - Needs 3L on exertion

## 2018-08-01 NOTE — Assessment & Plan Note (Signed)
-   Continue Trelegy 1 puff daily - Albuterol hfa/neb every 4-6 hours as needed for shortness of breath/wheezing - Continue to stay active

## 2018-08-01 NOTE — Progress Notes (Addendum)
@Patient  ID: Glenn Allison, male    DOB: December 07, 1955, 63 y.o.   MRN: [id number]  Chief Complaint  Patient presents with  . Follow-up    1 mo f/u for COPD. Increased SOB    Referring provider: Dois Davenport, MD  HPI: 63 year old male, former smoker. PMH significant for COPD, chronic respiratory failure with hypoxia, OSA, Lung RADS 3. Patient of Dr. Kendrick Fries. Due for LDCT with lung cancer screening program in May 2020.   07/03/2018 Patient called today for 1 week follow-up COPD exacerbation. Feels better, still a little congested but not as exhausted. He has been taking mucinex and Claritin as recommended. Continues to have some wheezing, not needing to use rescue inhaler as much but still requiring 2-3 times a day. Uses 3L oxygen on exertion. Sometimes feels he needs to using oxygen at rest. He does not have pulse oximeter to check oxygen level. Hasn't felt well enough to go out for his daily walk. Asking about cleaning devices for CPAP device, currently using vinegar solution. Denies fever.   08/01/2018 Patient presents today to re-qualify for oxygen. He was treated for COPD exacerbation with doxycycline and prednisone in early April. He is doing better, reports getting strongly every day. Took a long time for him to recover from last exacerbation. States that he wasn't himself. He has not been as active lately d/t covid restrictions and staying indoors. Using Albuterol rescue inhaler 4-5 times a day. Some of it may be anxiety. Not using cpap, has nasal pillows and sleeps with his mouth open. He is going to try again with full face mask. He did get soclean device. Do for low dose CT scan in May or June to follow pulmonary nodules.   Abnormal CT Chest 02/2018>> LR 3 Plan Follow up CT through Lung Cancer Screening Program 07/2018  No Known Allergies  Immunization History  Administered Date(s) Administered  . Pneumococcal Conjugate-13 10/12/2016  . Pneumococcal Polysaccharide-23  12/11/2017    Past Medical History:  Diagnosis Date  . COPD (chronic obstructive pulmonary disease) (HCC) 06/01/2016   Greater than 50-pack-year smoking history  . Hyperlipidemia   . Hypertension     Tobacco History: Social History   Tobacco Use  Smoking Status Former Smoker  . Packs/day: 1.50  . Years: 40.00  . Pack years: 60.00  . Types: Cigarettes  . Start date: 12/01/1972  . Last attempt to quit: 03/12/2012  . Years since quitting: 6.3  Smokeless Tobacco Never Used   Counseling given: Not Answered   Outpatient Medications Prior to Visit  Medication Sig Dispense Refill  . albuterol (PROAIR HFA) 108 (90 Base) MCG/ACT inhaler Inhale 1-2 puffs into the lungs every 6 (six) hours as needed for wheezing or shortness of breath. 1 Inhaler 5  . albuterol (PROVENTIL) (2.5 MG/3ML) 0.083% nebulizer solution PLEASE SEE ATTACHED FOR DETAILED DIRECTIONS 75 mL 12  . aspirin EC 81 MG tablet Take 1 tablet (81 mg total) by mouth daily.    Marland Kitchen atorvastatin (LIPITOR) 40 MG tablet TAKE 1 TABLET BY MOUTH EVERY DAY 30 tablet 6  . citalopram (CELEXA) 20 MG tablet TAKE 1 TABLET BY MOUTH DAILY (Patient taking differently: TAKE 0.5 TABLET BY MOUTH DAILY) 30 tablet 10  . clonazePAM (KLONOPIN) 0.5 MG tablet Take 1 tablet (0.5 mg total) by mouth 2 (two) times daily as needed for anxiety. 20 tablet 0  . ezetimibe (ZETIA) 10 MG tablet TAKE 1 TABLET BY MOUTH EVERY DAY 30 tablet 7  . hydrochlorothiazide (  MICROZIDE) 12.5 MG capsule TAKE ONE CAPSULE BY MOUTH EVERY DAY 90 capsule 1  . metFORMIN (GLUCOPHAGE-XR) 500 MG 24 hr tablet Take 500 mg by mouth daily.    . metoprolol succinate (TOPROL-XL) 100 MG 24 hr tablet TAKE 1 TABLET (100 MG TOTAL) BY MOUTH DAILY. TAKE WITH OR IMMEDIATELY FOLLOWING A MEAL. 90 tablet 3  . nitroGLYCERIN (NITROSTAT) 0.4 MG SL tablet 1 tab sublingual for significant dyspnea, may repeat every 5 minutes x2 if not relieved 25 tablet 1  . REXULTI 0.25 MG TABS Take 1 tablet by mouth every  morning. Every other morning  1  . TRELEGY ELLIPTA 100-62.5-25 MCG/INH AEPB TAKE 1 PUFF BY MOUTH EVERY DAY 60 each 6  . valsartan (DIOVAN) 80 MG tablet TAKE 1 TABLET BY MOUTH EVERY DAY 90 tablet 3  . Vitamin D, Ergocalciferol, (DRISDOL) 1.25 MG (50000 UT) CAPS capsule TAKE 1 CAPSULE BY MOUTH ONE TIME PER WEEK    . doxycycline (VIBRA-TABS) 100 MG tablet Take 1 tablet (100 mg total) by mouth 2 (two) times daily. 14 tablet 0  . predniSONE (DELTASONE) 10 MG tablet Take 2 tabs x 5 days; 1 tab x 5 days until complete 15 tablet 0   No facility-administered medications prior to visit.    Review of Systems  Review of Systems  Constitutional: Positive for fatigue.  HENT: Negative.   Respiratory: Positive for shortness of breath. Negative for cough and wheezing.   Cardiovascular: Negative.    Physical Exam  BP 110/70 (BP Location: Left Arm, Patient Position: Sitting, Cuff Size: Normal)   Pulse 84   Temp 98.3 F (36.8 C) (Oral)   Ht 6' (1.829 m)   Wt 192 lb 9.6 oz (87.4 kg)   SpO2 94%   BMI 26.12 kg/m  Physical Exam Constitutional:      Appearance: Normal appearance.  HENT:     Right Ear: Tympanic membrane normal.     Left Ear: Tympanic membrane normal.     Nose: Nose normal.     Mouth/Throat:     Mouth: Mucous membranes are moist.     Pharynx: Oropharynx is clear.  Neck:     Musculoskeletal: Normal range of motion and neck supple.  Cardiovascular:     Rate and Rhythm: Normal rate and regular rhythm.  Pulmonary:     Effort: Pulmonary effort is normal. No respiratory distress.     Breath sounds: No wheezing or rhonchi.     Comments: CTA, diminished  Musculoskeletal: Normal range of motion.  Skin:    General: Skin is warm and dry.  Neurological:     General: No focal deficit present.     Mental Status: He is alert and oriented to person, place, and time. Mental status is at baseline.  Psychiatric:        Mood and Affect: Mood normal.        Behavior: Behavior normal.         Thought Content: Thought content normal.        Judgment: Judgment normal.      Lab Results:  CBC    Component Value Date/Time   WBC 9.2 07/19/2016 0822   RBC 4.98 07/19/2016 0822   HGB 15.3 07/19/2016 0822   HCT 45.0 07/19/2016 0822   PLT 279 07/19/2016 0822   MCV 90 07/19/2016 0822   MCH 30.7 07/19/2016 0822   MCHC 34.0 07/19/2016 0822   RDW 13.6 07/19/2016 0822   LYMPHSABS 3.1 07/19/2016 0822   EOSABS 0.1 07/19/2016  [id number]   BASOSABS 0.0 07/19/2016 0822    BMET    Component Value Date/Time   NA 138 08/14/2017 1054   K 4.4 08/14/2017 1054   CL 100 08/14/2017 1054   CO2 26 08/14/2017 1054   GLUCOSE 93 08/14/2017 1054   BUN 14 08/14/2017 1054   CREATININE 1.10 08/14/2017 1054   CALCIUM 9.3 08/14/2017 1054   GFRNONAA 73 08/14/2017 1054   GFRAA 84 08/14/2017 1054    BNP No results found for: BNP  ProBNP    Component Value Date/Time   PROBNP 33 06/15/2016 1039    Imaging: No results found.   Assessment & Plan:   Chronic respiratory failure, unsp w hypoxia or hypercapnia (HCC) - Re-qualified for oxygen today - Needs 3L on exertion  COPD (chronic obstructive pulmonary disease) (HCC) - Continue Trelegy 1 puff daily - Albuterol hfa/neb every 4-6 hours as needed for shortness of breath/wheezing - Continue to stay active   OSA (obstructive sleep apnea) - Poor compliance d/t patient having issues with nasal mask leaking  - Needs full face mask   Flu-like symptoms - Prior flu like symptoms 4-6 weeks ago - Check Covid serology   FU in 3-4 months with Dr. Kendrick FriesMcQuaid or sooner if needed  Glenford BayleyElizabeth W Quita Mcgrory, NP 08/01/2018

## 2018-08-01 NOTE — Assessment & Plan Note (Signed)
-   Poor compliance d/t patient having issues with nasal mask leaking  - Needs full face mask

## 2018-08-02 LAB — SAR COV2 SEROLOGY (COVID19)AB(IGG),IA: SARS CoV2 AB IGG: NEGATIVE

## 2018-08-04 NOTE — Progress Notes (Signed)
Reviewed, agree 

## 2018-08-05 NOTE — Progress Notes (Signed)
Please let patient know COVID serology was negative

## 2018-08-12 ENCOUNTER — Other Ambulatory Visit: Payer: Self-pay | Admitting: Primary Care

## 2018-08-12 ENCOUNTER — Telehealth: Payer: Self-pay

## 2018-08-12 ENCOUNTER — Telehealth: Payer: Self-pay | Admitting: Primary Care

## 2018-08-12 DIAGNOSIS — R911 Solitary pulmonary nodule: Secondary | ICD-10-CM

## 2018-08-12 NOTE — Telephone Encounter (Signed)
Patient needs repeat LDCT scan in June d/t LUNG RADS 3. Patient under grant, denied by medicaid.

## 2018-08-12 NOTE — Telephone Encounter (Signed)
Spoke with pt regarding lung cancer screening.  Pt had been being followed for lung cancer screening through grant at [facility].  That grant is no longer available.  F/U screening chest ct is due 08/2018.  This was scheduled and denied by Medicaid.  Pt states that he cannot afford to pay out of pocket for the Ct and would like to wait and be scheduled through the upcoming grant through [facility] cone .  The first available appt through this grant is 09/22/18 6:00.  Pt agreed with this appt.  Pt will be scheduled for Low dose ct on 09/23/18 at [facility] imaging.  Pt verbalized understanding.  Nothing further needed at this time.

## 2018-08-12 NOTE — Telephone Encounter (Signed)
YOUR CARDIOLOGY TEAM HAS ARRANGED FOR AN E-VISIT FOR YOUR APPOINTMENT - PLEASE REVIEW IMPORTANT INFORMATION BELOW SEVERAL DAYS PRIOR TO YOUR APPOINTMENT ° °Due to the recent COVID-19 pandemic, we are transitioning in-person office visits to tele-medicine visits in an effort to decrease unnecessary exposure to our patients, their families, and staff. These visits are billed to your insurance just like a normal visit is. We also encourage you to sign up for MyChart if you have not already done so. You will need a smartphone if possible. For patients that do not have this, we can still complete the visit using a regular telephone but do prefer a smartphone to enable video when possible. You may have a family member that lives with you that can help. If possible, we also ask that you have a blood pressure cuff and scale at home to measure your blood pressure, heart rate and weight prior to your scheduled appointment. Patients with clinical needs that need an in-person evaluation and testing will still be able to come to the office if absolutely necessary. If you have any questions, feel free to call our office. ° ° ° ° °YOUR PROVIDER WILL BE USING THE FOLLOWING PLATFORM TO COMPLETE YOUR VISIT: Phone Call ° °• IF USING MYCHART - How to Download the MyChart App to Your SmartPhone  ° °- If Apple, go to App Store and type in MyChart in the search bar and download the app. If Android, ask patient to go to Google Play Store and type in MyChart in the search bar and download the app. The app is free but as with any other app downloads, your phone may require you to verify saved payment information or Apple/Android password.  °- You will need to then log into the app with your MyChart username and password, and select [facility] as your healthcare provider to link the account.  °- When it is time for your visit, go to the MyChart app, find appointments, and click Begin Video Visit. Be sure to Select Allow for your device to  access the Microphone and Camera for your visit. You will then be connected, and your provider will be with you shortly. ° **If you have any issues connecting or need assistance, please contact MyChart service desk [phone] ([phone])** ° **If using a computer, in order to ensure the best quality for your visit, you will need to use either of the following Internet Browsers: Google Chrome or Microsoft Edge** ° °• IF USING DOXIMITY or [email or web address].ME - The staff will give you instructions on receiving your link to join the meeting the day of your visit.  ° ° ° ° °2-3 DAYS BEFORE YOUR APPOINTMENT ° °You will receive a telephone call from one of our [facility] team members - your caller ID may say "Unknown caller." If this is a video visit, we will walk you through how to get the video launched on your phone. We will remind you check your blood pressure, heart rate and weight prior to your scheduled appointment. If you have an Apple Watch or Kardia, please upload any pertinent ECG strips the day before or morning of your appointment to MyChart. Our staff will also make sure you have reviewed the consent and agree to move forward with your scheduled tele-health visit.  ° ° ° °THE DAY OF YOUR APPOINTMENT ° °Approximately 15 minutes prior to your scheduled appointment, you will receive a telephone call from one of [facility] team - your caller ID may say "Unknown caller."    Our staff will confirm medications, vital signs for the day and any symptoms you may be experiencing. Please have this information available prior to the time of visit start. It may also be helpful for you to have a pad of paper and pen handy for any instructions given during your visit. They will also walk you through joining the smartphone meeting if this is a video visit. ° ° ° °CONSENT FOR TELE-HEALTH VISIT - PLEASE REVIEW ° °I hereby voluntarily request, consent and authorize [facility] and its employed or contracted physicians, physician  assistants, nurse practitioners or other licensed health care professionals (the Practitioner), to provide me with telemedicine health care services (the “Services") as deemed necessary by the treating Practitioner. I acknowledge and consent to receive the Services by the Practitioner via telemedicine. I understand that the telemedicine visit will involve communicating with the Practitioner through live audiovisual communication technology and the disclosure of certain medical information by electronic transmission. I acknowledge that I have been given the opportunity to request an in-person assessment or other available alternative prior to the telemedicine visit and am voluntarily participating in the telemedicine visit. ° °I understand that I have the right to withhold or withdraw my consent to the use of telemedicine in the course of my care at any time, without affecting my right to future care or treatment, and that the Practitioner or I may terminate the telemedicine visit at any time. I understand that I have the right to inspect all information obtained and/or recorded in the course of the telemedicine visit and may receive copies of available information for a reasonable fee.  I understand that some of the potential risks of receiving the Services via telemedicine include:  °• Delay or interruption in medical evaluation due to technological equipment failure or disruption; °• Information transmitted may not be sufficient (e.g. poor resolution of images) to allow for appropriate medical decision making by the Practitioner; and/or  °• In rare instances, security protocols could fail, causing a breach of personal health information. ° °Furthermore, I acknowledge that it is my responsibility to provide information about my medical history, conditions and care that is complete and accurate to the best of my ability. I acknowledge that Practitioner's advice, recommendations, and/or decision may be based on  factors not within their control, such as incomplete or inaccurate data provided by me or distortions of diagnostic images or specimens that may result from electronic transmissions. I understand that the practice of medicine is not an exact science and that Practitioner makes no warranties or guarantees regarding treatment outcomes. I acknowledge that I will receive a copy of this consent concurrently upon execution via email to the email address I last provided but may also request a printed copy by calling the office of [facility].   ° °I understand that my insurance will be billed for this visit.  ° °I have read or had this consent read to me. °• I understand the contents of this consent, which adequately explains the benefits and risks of the Services being provided via telemedicine.  °• I have been provided ample opportunity to ask questions regarding this consent and the Services and have had my questions answered to my satisfaction. °• I give my informed consent for the services to be provided through the use of telemedicine in my medical care ° °By participating in this telemedicine visit I agree to the above. °

## 2018-08-18 ENCOUNTER — Ambulatory Visit: Payer: Self-pay | Admitting: Interventional Cardiology

## 2018-08-27 ENCOUNTER — Telehealth: Payer: Self-pay

## 2018-08-27 NOTE — Telephone Encounter (Signed)
called pt to offer him an In-Office visit instead of the VV. He stated that he feels like it is safer for him to keep it as a VV at this time.

## 2018-08-31 NOTE — Progress Notes (Signed)
nOT NEEDED.

## 2018-09-01 ENCOUNTER — Telehealth (INDEPENDENT_AMBULATORY_CARE_PROVIDER_SITE_OTHER): Payer: Medicaid Other | Admitting: Interventional Cardiology

## 2018-09-01 ENCOUNTER — Other Ambulatory Visit: Payer: Self-pay

## 2018-09-01 ENCOUNTER — Encounter: Payer: Self-pay | Admitting: Interventional Cardiology

## 2018-09-01 VITALS — BP 145/75 | HR 63 | Ht 72.0 in | Wt 197.0 lb

## 2018-09-01 DIAGNOSIS — I10 Essential (primary) hypertension: Secondary | ICD-10-CM

## 2018-09-01 DIAGNOSIS — F17210 Nicotine dependence, cigarettes, uncomplicated: Secondary | ICD-10-CM

## 2018-09-01 DIAGNOSIS — E785 Hyperlipidemia, unspecified: Secondary | ICD-10-CM | POA: Diagnosis not present

## 2018-09-01 DIAGNOSIS — J432 Centrilobular emphysema: Secondary | ICD-10-CM | POA: Diagnosis not present

## 2018-09-01 DIAGNOSIS — Z7189 Other specified counseling: Secondary | ICD-10-CM

## 2018-09-01 MED ORDER — NITROGLYCERIN 0.4 MG SL SUBL: SUBLINGUAL_TABLET | SUBLINGUAL | 1 refills | Status: AC

## 2018-09-01 NOTE — Patient Instructions (Signed)

## 2018-09-01 NOTE — Progress Notes (Signed)
Virtual Visit via Video Note   This visit type was conducted due to national recommendations for restrictions regarding the COVID-19 Pandemic (e.g. social distancing) in an effort to limit this patient's exposure and mitigate transmission in our community.  Due to his co-morbid illnesses, this patient is at least at moderate risk for complications without adequate follow up.  This format is felt to be most appropriate for this patient at this time.  All issues noted in this document were discussed and addressed.  A limited physical exam was performed with this format.  Please refer to the patient's chart for his consent [facility].   Date:  09/01/2018   ID:  Glenn BullionOscar E Allison, DOB 1955/10/16, MRN [id number]  Patient Location: Home Provider Location: Office  PCP:  Dois Davenportichter, Karen L, MD  Cardiologist:  Lesleigh NoeHenry W Smith III, MD  Electrophysiologist:  None   Evaluation Performed:  Follow-Up Visit  Chief Complaint:  Hypertension  History of Present Illness:    Glenn Allison is a 63 y.o. male with presents for HTN, Hyperlipidemia, COPD, and tobacco abuse.  He is doing well.  He has not had chest pain, orthopnea, PND, lower extremity swelling, or any significant cardiac affect.  He has been locked down with him his house due to COVID-19 pandemic.  He has been particularly careful because of his underlying pre-existing conditions.  The patient does not have symptoms concerning for COVID-19 infection (fever, chills, cough, or new shortness of breath).    Past Medical History:  Diagnosis Date  . COPD (chronic obstructive pulmonary disease) (HCC) 06/01/2016   Greater than 50-pack-year smoking history  . Hyperlipidemia   . Hypertension    Past Surgical History:  Procedure Laterality Date  . LEFT HEART CATH AND CORONARY ANGIOGRAPHY N/A 07/24/2016   Procedure: Left Heart Cath and Coronary Angiography;  Surgeon: Lyn RecordsSmith, Henry W, MD;  [facility];  Service:  Cardiovascular;  Laterality: N/A;  . None       Current Meds  Medication Sig  . albuterol (PROAIR HFA) 108 (90 Base) MCG/ACT inhaler Inhale 1-2 puffs into the lungs every 6 (six) hours as needed for wheezing or shortness of breath.  Marland Kitchen. albuterol (PROVENTIL) (2.5 MG/3ML) 0.083% nebulizer solution PLEASE SEE ATTACHED FOR DETAILED DIRECTIONS  . aspirin EC 81 MG tablet Take 1 tablet (81 mg total) by mouth daily.  Marland Kitchen. atorvastatin (LIPITOR) 40 MG tablet TAKE 1 TABLET BY MOUTH EVERY DAY  . citalopram (CELEXA) 20 MG tablet Take 20 mg by mouth daily.  . clonazePAM (KLONOPIN) 0.5 MG tablet Take 1 tablet (0.5 mg total) by mouth 2 (two) times daily as needed for anxiety.  Marland Kitchen. ezetimibe (ZETIA) 10 MG tablet TAKE 1 TABLET BY MOUTH EVERY DAY  . hydrochlorothiazide (MICROZIDE) 12.5 MG capsule TAKE ONE CAPSULE BY MOUTH EVERY DAY  . metFORMIN (GLUCOPHAGE-XR) 500 MG 24 hr tablet Take 500 mg by mouth daily.  . metoprolol succinate (TOPROL-XL) 100 MG 24 hr tablet TAKE 1 TABLET (100 MG TOTAL) BY MOUTH DAILY. TAKE WITH OR IMMEDIATELY FOLLOWING A MEAL.  . nitroGLYCERIN (NITROSTAT) 0.4 MG SL tablet 1 tab sublingual for significant dyspnea, may repeat every 5 minutes x2 if not relieved  . REXULTI 0.25 MG TABS Take 1 tablet by mouth every morning. Monday, Wed, Friday  . TRELEGY ELLIPTA 100-62.5-25 MCG/INH AEPB TAKE 1 PUFF BY MOUTH EVERY DAY  . valsartan (DIOVAN) 80 MG tablet TAKE 1 TABLET BY MOUTH EVERY DAY  . Vitamin D,  Ergocalciferol, (DRISDOL) 1.25 MG (50000 UT) CAPS capsule TAKE 1 CAPSULE BY MOUTH ONE TIME PER WEEK     Allergies:   Patient has no known allergies.   Social History   Tobacco Use  . Smoking status: Former Smoker    Packs/day: 1.50    Years: 40.00    Pack years: 60.00    Types: Cigarettes    Start date: 12/01/1972    Quit date: 03/12/2012    Years since quitting: 6.4  . Smokeless tobacco: Never Used  Substance Use Topics  . Alcohol use: Yes    Alcohol/week: 1.0 standard drinks    Types: 1  Standard drinks or equivalent per week    Comment: socially  . Drug use: No     Family Hx: The patient's family history includes Alcohol abuse in his father; Diabetes in his mother; Heart disease in his mother; Hyperlipidemia in his brother and mother; Hypertension in his mother.  ROS:   Please see the history of present illness.    No cough, chills, fever, edema, although he has had some anxiety and depression. All other systems reviewed and are negative.   Prior CV studies:   The following studies were reviewed today:  No new or recent clinical data by imaging or functional testing.  Labs/Other Tests and Data Reviewed:    EKG:  No ECG reviewed.  Recent Labs: No results found for requested labs within last 8760 hours.   Recent Lipid Panel Lab Results  Component Value Date/Time   CHOL 133 06/24/2017 08:58 AM   TRIG 48 06/24/2017 08:58 AM   HDL 57 06/24/2017 08:58 AM   CHOLHDL 2.3 06/24/2017 08:58 AM   LDLCALC 66 06/24/2017 08:58 AM    Wt Readings from Last 3 Encounters:  09/01/18 197 lb (89.4 kg)  08/01/18 192 lb 9.6 oz (87.4 kg)  05/12/18 194 lb 6.4 oz (88.2 kg)     Objective:    Vital Signs:  BP (!) 145/75   Pulse 63   Ht 6' (1.829 m)   Wt 197 lb (89.4 kg)   BMI 26.72 kg/m    VITAL SIGNS:  reviewed  ASSESSMENT & PLAN:    1. Essential hypertension   2. Centrilobular emphysema (HCC)   3. Hyperlipidemia, unspecified hyperlipidemia type   4. Cigarette smoker   5. Educated About Covid-19 Virus Infection    PLAN: 1. The blood pressure recorded this morning was prior to taking medications.  Salt restriction and exercise reviewed.  Avoidance of nonsteroidals reviewed. 2. Now being followed by Dr. Nadyne CoombesKaren Richter.  Also sees Dr. Heber CarolinaBrent Mcquaid. 3. LDL target should be 70 or less since he does have mild nonobstructive coronary artery disease documented by cardiac catheterization. 4. Continue to abstain from cigarette smoking.  Overall education and awareness  concerning primary/secondary risk prevention was discussed in detail: LDL less than 70, hemoglobin A1c less than 7, blood pressure target less than 130/80 mmHg, >150 minutes of moderate aerobic activity per week, avoidance of smoking, weight control (via diet and exercise), and continued surveillance/management of/for obstructive sleep apnea.  Will defer further office visit of to Dr. Hal Hopeichter and Dr. Kendrick FriesMcQuaid.  No significant CV issues currently.  Happy to see him again in the future when or if necessary.  COVID-19 Education: The signs and symptoms of COVID-19 were discussed with the patient and how to seek care for testing (follow up with PCP or arrange E-visit).  The importance of social distancing was discussed today.  Time:  Today, I have spent 12 minutes with the patient with telehealth technology discussing the above problems.     Medication Adjustments/Labs and Tests Ordered: Current medicines are reviewed at length with the patient today.  Concerns regarding medicines are outlined above.   Tests Ordered: No orders of the defined types were placed in this encounter.   Medication Changes: No orders of the defined types were placed in this encounter.   Follow Up:  In Person prn  Signed, Sinclair Grooms, MD  09/01/2018 9:33 AM    [place]

## 2018-09-04 ENCOUNTER — Other Ambulatory Visit: Payer: Self-pay | Admitting: Interventional Cardiology

## 2018-09-22 ENCOUNTER — Other Ambulatory Visit: Payer: Self-pay | Admitting: Primary Care

## 2018-09-22 ENCOUNTER — Ambulatory Visit: Payer: Medicaid Other

## 2018-09-22 DIAGNOSIS — R911 Solitary pulmonary nodule: Secondary | ICD-10-CM

## 2018-09-22 NOTE — Progress Notes (Deleted)
Shared Decision-Making Visit for Lung Cancer Screening [phone] )  Lung Cancer Screening Criteria 65-74-years of age 62+ pack year smoking history No Recent History of coughing up blood   No Unexplained weight loss of > 15 pounds in the last 6 months. No Prior History Lung / other cancer  (Diagnosis within the last 5 years already requiring surveillance chest CT Scans). Pt is a current smoker, or former smoker who has quit within the last 15 years.  This patient meets the criterial noted above and is asymptomatic for any signs or symptoms of lung cancer.  The Shared Decision-Making Visit discussion included risks and benefits of screening, potential for follow up diagnostic testing for abnormal scans, potential for false positive tests, over diagnosis, and discussion about total radiation exposure. Patient stated willingness to undergo diagnostics and treatment as needed. Current smokers were counseled on smoking cessation as the single most powerful action they can take to decrease their risk of lung cancer, pulmonary disease, heart disease and stroke. They were given a resource card with information on receiving free nicotine replacement therapy , and information about free smoking cessation classes.  Pt understands that this scan is being paid for by a grant obtained by the [facility]. We discussed  that there is no guarantee of additional grant money from year to year as these grants are not guaranteed to programs on an annual basis.They have to be applied for and they are awarded based on county need and utilization of the previous years funds.Magdalen Spatz, AGACNP-BC [facility] Pulmonary/Critical Care Medicine Pager # [phone] 09/22/2018

## 2018-11-24 ENCOUNTER — Other Ambulatory Visit: Payer: Self-pay | Admitting: Acute Care

## 2018-11-24 ENCOUNTER — Other Ambulatory Visit: Payer: Self-pay

## 2018-11-24 DIAGNOSIS — Z87891 Personal history of nicotine dependence: Secondary | ICD-10-CM

## 2018-11-24 NOTE — Progress Notes (Signed)
Shared Decision-Making Visit for Lung Cancer Screening [phone] )  Lung Cancer Screening Criteria 38-75-years of age 62+ pack year smoking history No Recent History of coughing up blood   No Unexplained weight loss of > 15 pounds in the last 6 months. No Prior History Lung / other cancer  (Diagnosis within the last 5 years already requiring surveillance chest CT Scans). Pt is a current smoker, or former smoker who has quit within the last 15 years.  This patient meets the criterial noted above and is asymptomatic for any signs or symptoms of lung cancer.  The Shared Decision-Making Visit discussion included risks and benefits of screening, potential for follow up diagnostic testing for abnormal scans, potential for false positive tests, over diagnosis, and discussion about total radiation exposure. Patient stated willingness to undergo diagnostics and treatment as needed. Current smokers were counseled on smoking cessation as the single most powerful action they can take to decrease their risk of lung cancer, pulmonary disease, heart disease and stroke. They were given a resource card with information on receiving free nicotine replacement therapy , and information about free smoking cessation classes.  Pt understands that this scan is being paid for by a grant obtained by the [facility]. We discussed  that there is no guarantee of additional grant money from year to year as these grants are not guaranteed to programs on an annual basis.They have to be applied for and they are awarded based on county need, and utilization of the previous years funds..   Pt. States he is doing well. He is a former smoker. He has been screened through the [employer]. He has had some abnormal scans and he is currently due for a 6 month follow up. Last scan 02/2018 was a Lung  RADS 3, nodules that are probably benign findings, short term follow up suggested: includes nodules with a low  likelihood of becoming a clinically active cancer. Radiology recommends a 6 month repeat LDCT follow up.  He states he is doing well. He is compliant with his Trelegy and Albuterol inhaler . He uses his oxygen with exertion. He is maintaining sats of > 88%. He is following COVID precautions. He is compliant with CPAP nightly.   He has been provided with a smoking cessation card. Blue Card # 8 He understands that he will be called tomorrow to schedule his scan  Magdalen Spatz, [facility] Pager # [phone] 11/24/2018 7:07 PM

## 2018-11-28 ENCOUNTER — Other Ambulatory Visit: Payer: Self-pay | Admitting: *Deleted

## 2018-11-28 DIAGNOSIS — Z87891 Personal history of nicotine dependence: Secondary | ICD-10-CM

## 2018-11-28 DIAGNOSIS — Z122 Encounter for screening for malignant neoplasm of respiratory organs: Secondary | ICD-10-CM

## 2018-12-09 ENCOUNTER — Other Ambulatory Visit: Payer: Self-pay

## 2018-12-09 ENCOUNTER — Other Ambulatory Visit: Payer: Self-pay | Admitting: Pulmonary Disease

## 2018-12-09 ENCOUNTER — Ambulatory Visit
Admission: RE | Admit: 2018-12-09 | Discharge: 2018-12-09 | Disposition: A | Discharge status: Home / Self Care | Payer: No Typology Code available for payment source | Source: Ambulatory Visit | Attending: Acute Care | Admitting: Acute Care

## 2018-12-09 DIAGNOSIS — Z122 Encounter for screening for malignant neoplasm of respiratory organs: Secondary | ICD-10-CM

## 2018-12-09 DIAGNOSIS — Z87891 Personal history of nicotine dependence: Secondary | ICD-10-CM

## 2019-01-06 ENCOUNTER — Other Ambulatory Visit: Payer: Self-pay | Admitting: Cardiology

## 2019-02-11 ENCOUNTER — Other Ambulatory Visit: Payer: Self-pay | Admitting: Interventional Cardiology

## 2019-02-11 DIAGNOSIS — I10 Essential (primary) hypertension: Secondary | ICD-10-CM

## 2019-04-13 ENCOUNTER — Other Ambulatory Visit: Payer: Self-pay | Admitting: Pulmonary Disease

## 2019-04-14 ENCOUNTER — Other Ambulatory Visit: Payer: Self-pay | Admitting: Pulmonary Disease

## 2019-04-14 DIAGNOSIS — G4734 Idiopathic sleep related nonobstructive alveolar hypoventilation: Secondary | ICD-10-CM

## 2019-04-28 ENCOUNTER — Telehealth: Payer: Self-pay | Admitting: Primary Care

## 2019-04-28 NOTE — Telephone Encounter (Signed)
I'll fill it out 

## 2019-04-28 NOTE — Telephone Encounter (Signed)
Handicap placard form has been placed on Glenn Allison's desk to be signed.

## 2019-04-28 NOTE — Telephone Encounter (Signed)
Spoke with pt. He is requesting a handicap placard form to be filled out. Advised him that we have not seen him since 07/2018 and he might need an appointment. He agreed and verbalized understanding.  Beth - would you be willing to fill this out or should he be scheduled for an appointment? Thanks.

## 2019-04-29 NOTE — Telephone Encounter (Signed)
Handicap placard has been signed by Waynetta Sandy NP Called spoke with patient, he would like it mailed to him Verified home address - placed in the mail  Nothing further needed; will sign off

## 2019-05-11 ENCOUNTER — Other Ambulatory Visit: Payer: Self-pay | Admitting: Pulmonary Disease

## 2019-05-19 ENCOUNTER — Other Ambulatory Visit: Payer: Self-pay

## 2019-05-19 ENCOUNTER — Ambulatory Visit (INDEPENDENT_AMBULATORY_CARE_PROVIDER_SITE_OTHER): Payer: Medicaid Other | Admitting: Pulmonary Disease

## 2019-05-19 ENCOUNTER — Encounter: Payer: Self-pay | Admitting: Pulmonary Disease

## 2019-05-19 VITALS — BP 142/80 | HR 60 | Temp 98.2°F | Ht 72.0 in | Wt 201.2 lb

## 2019-05-19 DIAGNOSIS — G4733 Obstructive sleep apnea (adult) (pediatric): Secondary | ICD-10-CM | POA: Diagnosis not present

## 2019-05-19 DIAGNOSIS — J9610 Chronic respiratory failure, unspecified whether with hypoxia or hypercapnia: Secondary | ICD-10-CM

## 2019-05-19 DIAGNOSIS — J449 Chronic obstructive pulmonary disease, unspecified: Secondary | ICD-10-CM | POA: Diagnosis not present

## 2019-05-19 DIAGNOSIS — Z9989 Dependence on other enabling machines and devices: Secondary | ICD-10-CM

## 2019-05-19 NOTE — Patient Instructions (Signed)
COPD -Continue Trelegy -Continue albuterol as needed  Obstructive sleep apnea -Continue CPAP use  Encouraged to continue physical activity as tolerated Oxygen supplementation  Call with any significant concerns  We will see you in 6 months  Make sure you follow-up with your CT for lung cancer screening yearly

## 2019-05-19 NOTE — Progress Notes (Signed)
Glenn Allison    [id number]    1955-06-16  Primary Care Physician:Richter, Marcos Eke, MD  Referring Physician: Dois Davenport, MD [address] [place],  [place] [address]  Chief complaint:   History of COPD History of obstructive sleep apnea  HPI: No significant exacerbation in his symptoms recently Has been functioning relatively well On oxygen supplementation with exertion  Sleeping well, tolerating CPAP well.  Recent CT scan of the chest result reviewed with the patient-multiple nodules, emphysema-stable from previous  No recent exacerbation of the COPD No hospital visits   He is not having any fevers or chills  Was recently started on CPAP therapy about November 2019-has been compliant  Has a history of PTSD Usually does not like using medications  Usually functions well  Outpatient Encounter Medications as of 05/19/2019  Medication Sig  . albuterol (PROAIR HFA) 108 (90 Base) MCG/ACT inhaler Inhale 1-2 puffs into the lungs every 6 (six) hours as needed for wheezing or shortness of breath.  Marland Kitchen albuterol (PROVENTIL) (2.5 MG/3ML) 0.083% nebulizer solution PLEASE SEE ATTACHED FOR DETAILED DIRECTIONS  . aspirin EC 81 MG tablet Take 1 tablet (81 mg total) by mouth daily.  Marland Kitchen atorvastatin (LIPITOR) 40 MG tablet TAKE 1 TABLET BY MOUTH EVERY DAY  . citalopram (CELEXA) 20 MG tablet Take 20 mg by mouth daily.  . clonazePAM (KLONOPIN) 0.5 MG tablet Take 1 tablet (0.5 mg total) by mouth 2 (two) times daily as needed for anxiety.  Marland Kitchen ezetimibe (ZETIA) 10 MG tablet TAKE 1 TABLET BY MOUTH EVERY DAY  . hydrochlorothiazide (MICROZIDE) 12.5 MG capsule TAKE ONE CAPSULE BY MOUTH EVERY DAY  . metFORMIN (GLUCOPHAGE-XR) 500 MG 24 hr tablet Take 500 mg by mouth daily.  . metoprolol succinate (TOPROL-XL) 100 MG 24 hr tablet TAKE 1 TABLET (100 MG TOTAL) BY MOUTH DAILY. TAKE WITH OR IMMEDIATELY FOLLOWING A MEAL.  . nitroGLYCERIN (NITROSTAT) 0.4 MG SL tablet 1 tab sublingual  for significant dyspnea, may repeat every 5 minutes x2 if not relieved  . TRELEGY ELLIPTA 100-62.5-25 MCG/INH AEPB INHALE 1 PUFF BY MOUTH EVERY DAY  . valsartan (DIOVAN) 80 MG tablet TAKE 1 TABLET BY MOUTH EVERY DAY  . Vitamin D, Ergocalciferol, (DRISDOL) 1.25 MG (50000 UT) CAPS capsule TAKE 1 CAPSULE BY MOUTH ONE TIME PER WEEK  . [DISCONTINUED] REXULTI 0.25 MG TABS Take 1 tablet by mouth every morning. Monday, Wed, Friday   No facility-administered encounter medications on file as of 05/19/2019.    Allergies as of 05/19/2019  . (No Known Allergies)    Past Medical History:  Diagnosis Date  . COPD (chronic obstructive pulmonary disease) (HCC) 06/01/2016   Greater than 50-pack-year smoking history  . Hyperlipidemia   . Hypertension     Past Surgical History:  Procedure Laterality Date  . LEFT HEART CATH AND CORONARY ANGIOGRAPHY N/A 07/24/2016   Procedure: Left Heart Cath and Coronary Angiography;  Surgeon: Lyn Records, MD;  Location: [facility] INVASIVE CV LAB;  Service: Cardiovascular;  Laterality: N/A;  . None      Family History  Problem Relation Age of Onset  . Heart disease Mother        2 stents  . Diabetes Mother   . Hyperlipidemia Mother   . Hypertension Mother   . Alcohol abuse Father   . Hyperlipidemia Brother     Social History   Socioeconomic History  . Marital status: Single    Spouse name:  Not on file  . Number of children: 0  . Years of education: GTI  . Highest education level: Not on file  Occupational History  . 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 Smoking status: Former Smoker    Packs/day: 1.50    Years: 40.00    Pack years: 60.00    Types: Cigarettes    Start date: 12/01/1972    Quit date: 03/12/2012    Years since quitting: 7.1  . Smokeless tobacco: Never Used  Substance and Sexual Activity  . Alcohol use: Yes    Alcohol/week: 1.0 standard drinks    Types: 1 Standard drinks or equivalent per week     Comment: socially  . Drug use: No  . Sexual activity: Yes    Partners: Female  Other Topics Concern  . 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   Grew up in [place] and graduated from [employer].   Made living in [place].   Cared for his mother for 10 years and has ignored his own health.   Not seen by a doctor for 16 years.   Social Determinants of Health   Financial Resource Strain:   . Difficulty of Paying Living Expenses: Not on file  Food Insecurity:   . Worried About Charity fundraiser in the Last Year: Not on file  . Ran Out of Food in the Last Year: Not on file  Transportation Needs:   . Lack of Transportation (Medical): Not on file  . Lack of Transportation (Non-Medical): Not on file  Physical Activity:   . Days of Exercise per Week: Not on file  . Minutes of Exercise per Session: Not on file  Stress:   . Feeling of Stress : Not on file  Social Connections:   . Frequency of Communication with Friends and Family: Not on file  . Frequency of Social Gatherings with Friends and Family: Not on file  . Attends Religious Services: Not on file  . Active Member of Clubs or Organizations: Not on file  . Attends Archivist Meetings: Not on file  . Marital Status: Not on file  Intimate Partner Violence:   . Fear of Current or Ex-Partner: Not on file  . Emotionally Abused: Not on file  . Physically Abused: Not on file  . Sexually Abused: Not on file    Review of Systems  Constitutional: Positive for fatigue. Negative for fever.  HENT: Negative.   Eyes: Negative.   Respiratory: Positive for cough and shortness of breath.   Cardiovascular: Negative.     Vitals:   05/19/19 1214  BP: (!) 142/80  Pulse: 60  Temp: 98.2 F (36.8 C)  SpO2: 97%     Physical Exam  Constitutional: He appears well-developed and well-nourished.  HENT:  Head: Normocephalic and atraumatic.    Eyes: Pupils are equal, round, and reactive to light. Conjunctivae and EOM are normal. Right eye exhibits no discharge. Left eye exhibits no discharge.  Neck: No tracheal deviation present. No thyromegaly present.  Cardiovascular: Normal rate and regular rhythm.  Pulmonary/Chest: Effort normal and breath sounds normal. No respiratory distress. He has no wheezes. He has no rales. He exhibits no tenderness.  Musculoskeletal:     Cervical back: Normal range of motion and neck supple.   Data Reviewed: Recent CT report from 12/09/2018  was reviewed with the patient   Compliance report from his CPAP is not available at present Most recent PFT from 2018 revealed very severe obstructive disease  Assessment:  Chronic obstructive lung disease-stable status  Obstructive sleep apnea-compliant with CPAP use  Plan/Recommendations:  Continue CPAP use -Encouraged to continue CPAP  Continue use of Trelegy Albuterol as needed Follow-up with lung cancer screening CTs  Encouraged to call if he has any significant symptoms  I will see him back in about 6 months  Virl Diamond MD [facility] 05/19/2019, 12:19 PM  CC: Dois Davenport, MD

## 2019-06-03 ENCOUNTER — Telehealth: Payer: Self-pay | Admitting: Interventional Cardiology

## 2019-06-03 NOTE — Telephone Encounter (Signed)
Spoke with pt and he has been having intermittent lightheadedness for 2-3 weeks.  Nothing makes it worse or better.  BPs normally around 130/80.  Highest in this timeframe was 145/90.  HRs usually 80-90.  Denies any cardiac issues.  States he thinks it is possibly related to his glasses because he wears them intermittently.  Advised if he is able to, go without his glasses for a few days to see if any issues with lightheadedness.  If none, reach out to eye doctor about possible prescription issues.  If continuous issue with lightheadedness even without glasses, reach out to PCP. Noticed pt is on medication for diabetes.  States he was told he is borderline and that is why he is on Metformin.  Unable to check blood sugars at home. Has not had A1C checked in about 9 months.  Reiterated importance of contacting PCP.  Pt verbalized understanding and was appreciative for call.

## 2019-06-03 NOTE — Telephone Encounter (Signed)
Patient calling in because he states that he has been feeling lightheaded recently. States that his BP is good, he is eating well, denies dizziness and chest pain. He states that he has some pulmonary problems so SOB is an everyday occurrence that he has learned to live with. He wants to know if he should come in and do some lab work or maybe have his medications adjusted.

## 2019-07-10 ENCOUNTER — Other Ambulatory Visit: Payer: Self-pay | Admitting: Primary Care

## 2019-07-28 ENCOUNTER — Telehealth: Payer: Self-pay | Admitting: Pulmonary Disease

## 2019-07-28 NOTE — Telephone Encounter (Signed)
Called and spoke to [place]

## 2019-07-28 NOTE — Telephone Encounter (Signed)
Spoke to Edgar @ [facility] pt is supposed to come in this week and see what different types of machines they have and see which works the best spoke to patient he is aware

## 2019-07-28 NOTE — Telephone Encounter (Signed)
[facility] will call me back on this one

## 2019-07-30 ENCOUNTER — Telehealth: Payer: Self-pay | Admitting: Pulmonary Disease

## 2019-07-30 NOTE — Telephone Encounter (Signed)
Does he just need a refill or PA?? LMTCB x 1 for the pt.

## 2019-07-31 NOTE — Telephone Encounter (Signed)
Spoke with the pt and he states trelegy needing PA  Called pharm  Awaiting PA form to be faxed to [place] office

## 2019-08-04 ENCOUNTER — Telehealth: Payer: Self-pay | Admitting: Pulmonary Disease

## 2019-08-04 NOTE — Telephone Encounter (Signed)
PA request was received from (pharmacy): yes [phone] Fax: [phone] Medication name and strength: Trelegy Ellipta 100-62.5-25 MCG/INH Ordering Provider: Wynona Neat  Was PA started with [facility]?: yes If yes, please enter KEY: [id number] Medication tried and failed: Incruse, Spiriva, Advair, Symbicort Covered Alternatives:   PA sent to plan, time frame for approval / denial: 3-4 business days Routing to [employer] for follow-up

## 2019-08-04 NOTE — Telephone Encounter (Signed)
PA was initiated by [facility]. Please refer to encounter from today 5/25.

## 2019-08-11 ENCOUNTER — Telehealth: Payer: Self-pay | Admitting: Pulmonary Disease

## 2019-08-11 MED ORDER — PREDNISONE 10 MG PO TABS: ORAL_TABLET | ORAL | 0 refills | Status: DC

## 2019-08-11 MED ORDER — TRELEGY ELLIPTA 100-62.5-25 MCG/INH IN AEPB: INHALATION_SPRAY | RESPIRATORY_TRACT | 0 refills | Status: DC

## 2019-08-11 MED ORDER — DOXYCYCLINE HYCLATE 100 MG PO TABS: 100.0000 mg | ORAL_TABLET | Freq: Two times a day (BID) | ORAL | 0 refills | Status: DC

## 2019-08-11 NOTE — Telephone Encounter (Signed)
Spoke with the pt and notified of response per [facility]  He verbalized understanding  Reminder already in place to see Dr Val Eagle in Sept 2021 Pt will call sooner if needed

## 2019-08-11 NOTE — Telephone Encounter (Signed)
Thank you for starting PA for Trelegy, will send in prednisone taper and doxycycline for COPD exacerbation. Due for follow-up in September with Dr. Wynona Neat. Needs to be seen sooner if not better or continues to have exacerbations of COPD symptoms

## 2019-08-11 NOTE — Telephone Encounter (Addendum)
Spoke with pt, he has been without his Trelegy for a month, He is SOB with cough with some wheezing. He thinks he has a bronchitis flare and is requesting Doxy and prednisone. I advised him that I could leave him a sample of Trelegy upfront to pick up. Patient agreed. I called [employer] to start PA on Trelegy. PA for Trelegy approved until 08/10/2020 PA [id number]. EW please advise on doxy and prednisone.

## 2019-08-20 NOTE — Telephone Encounter (Signed)
Routing to triage for follow-up. 

## 2019-08-31 NOTE — Telephone Encounter (Signed)
PA was approved, see alternate encounter.

## 2019-09-01 ENCOUNTER — Telehealth: Payer: Self-pay | Admitting: Pulmonary Disease

## 2019-09-01 NOTE — Telephone Encounter (Signed)
Called [facility] pharmacy to verify that the Trelegy ellipta was able to filled.  Pharmacy filled it and patient has already picked it up.

## 2019-09-09 ENCOUNTER — Other Ambulatory Visit: Payer: Self-pay | Admitting: Interventional Cardiology

## 2019-11-02 ENCOUNTER — Other Ambulatory Visit: Payer: Self-pay | Admitting: Interventional Cardiology

## 2019-11-02 DIAGNOSIS — I10 Essential (primary) hypertension: Secondary | ICD-10-CM

## 2019-12-08 ENCOUNTER — Telehealth: Payer: Self-pay | Admitting: Pulmonary Disease

## 2019-12-08 NOTE — Telephone Encounter (Signed)
Patient contacted, numbers for [facility] and [facility] verified. Patient is going to call them back.

## 2019-12-22 ENCOUNTER — Other Ambulatory Visit: Payer: Self-pay

## 2019-12-22 ENCOUNTER — Ambulatory Visit (INDEPENDENT_AMBULATORY_CARE_PROVIDER_SITE_OTHER): Payer: Medicaid Other | Admitting: Pulmonary Disease

## 2019-12-22 ENCOUNTER — Encounter: Payer: Self-pay | Admitting: Pulmonary Disease

## 2019-12-22 VITALS — BP 124/76 | HR 82 | Temp 96.6°F | Ht 72.0 in | Wt 207.8 lb

## 2019-12-22 DIAGNOSIS — J449 Chronic obstructive pulmonary disease, unspecified: Secondary | ICD-10-CM

## 2019-12-22 DIAGNOSIS — Z9989 Dependence on other enabling machines and devices: Secondary | ICD-10-CM | POA: Diagnosis not present

## 2019-12-22 DIAGNOSIS — G4733 Obstructive sleep apnea (adult) (pediatric): Secondary | ICD-10-CM | POA: Diagnosis not present

## 2019-12-22 NOTE — Patient Instructions (Signed)
Continue using the CPAP on a regular basis  Continue Trelegy  Graded exercises as tolerated  Continue with oxygen supplementation  Call with any significant concerns   I will see you back in 6 months

## 2019-12-22 NOTE — Progress Notes (Signed)
Glenn Allison    [id number]    [birth year]  Primary Care Physician:Richter, Marcos Eke, MD  Referring Physician: Dois Davenport, MD [address] [place],  [place] [address]  Chief complaint:   History of COPD History of obstructive sleep apnea  HPI:  Some exacerbation recently Back to using Trelegy on a regular basis  Continues to use CPAP on a regular basis and tolerating it well  Could not get a download from his machine today  He had run out of Trelegy for about a month -Back to using on a regular basis  Recent CT scan of the chest result reviewed with the patient-multiple nodules, emphysema-stable from previous  No hospital visits   He is not having any fevers or chills  Was recently started on CPAP therapy about November 2019-has been compliant  Has a history of PTSD Usually does not like using medications  Usually functions well  Outpatient Encounter Medications as of 12/22/2019  Medication Sig  . albuterol (PROAIR HFA) 108 (90 Base) MCG/ACT inhaler Inhale 1-2 puffs into the lungs every 6 (six) hours as needed for wheezing or shortness of breath.  Marland Kitchen albuterol (PROVENTIL) (2.5 MG/3ML) 0.083% nebulizer solution PLEASE SEE ATTACHED FOR DETAILED DIRECTIONS  . aspirin EC 81 MG tablet Take 1 tablet (81 mg total) by mouth daily.  Marland Kitchen atorvastatin (LIPITOR) 40 MG tablet TAKE 1 TABLET BY MOUTH EVERY DAY  . citalopram (CELEXA) 20 MG tablet Take 20 mg by mouth daily.  . clonazePAM (KLONOPIN) 0.5 MG tablet Take 1 tablet (0.5 mg total) by mouth 2 (two) times daily as needed for anxiety.  Marland Kitchen ezetimibe (ZETIA) 10 MG tablet TAKE 1 TABLET BY MOUTH EVERY DAY  . Fluticasone-Umeclidin-Vilant (TRELEGY ELLIPTA) 100-62.5-25 MCG/INH AEPB INHALE 1 PUFF BY MOUTH EVERY DAY  . hydrochlorothiazide (MICROZIDE) 12.5 MG capsule Take 1 capsule (12.5 mg total) by mouth daily. Must sch follow up appt for further refills [phone]  . metFORMIN (GLUCOPHAGE-XR) 500 MG 24 hr  tablet Take 500 mg by mouth daily.  . metoprolol succinate (TOPROL-XL) 100 MG 24 hr tablet Take 1 tablet (100 mg total) by mouth daily. Take with or immediately following a meal. Overdue follow up appt must be scheduled for further refills  . nitroGLYCERIN (NITROSTAT) 0.4 MG SL tablet 1 tab sublingual for significant dyspnea, may repeat every 5 minutes x2 if not relieved  . valsartan (DIOVAN) 80 MG tablet TAKE 1 TABLET BY MOUTH EVERY DAY  . Vitamin D, Ergocalciferol, (DRISDOL) 1.25 MG (50000 UT) CAPS capsule TAKE 1 CAPSULE BY MOUTH ONE TIME PER WEEK  . [DISCONTINUED] doxycycline (VIBRA-TABS) 100 MG tablet Take 1 tablet (100 mg total) by mouth 2 (two) times daily.  . [DISCONTINUED] predniSONE (DELTASONE) 10 MG tablet Take 4 tabs po daily x 3 days; then 3 tabs daily x3 days; then 2 tabs daily x3 days; then 1 tab daily x 3 days; then stop   No facility-administered encounter medications on file as of 12/22/2019.    Allergies as of 12/22/2019  . (No Known Allergies)    Past Medical History:  Diagnosis Date  . COPD (chronic obstructive pulmonary disease) (HCC) 06/01/2016   Greater than 50-pack-year smoking history  . Hyperlipidemia   . Hypertension     Past Surgical History:  Procedure Laterality Date  . LEFT HEART CATH AND CORONARY ANGIOGRAPHY N/A 07/24/2016   Procedure: Left Heart Cath and Coronary Angiography;  Surgeon: Lyn Records, MD;  Location: [facility] INVASIVE CV LAB;  Service: Cardiovascular;  Laterality: N/A;  . None      Family History  Problem Relation Age of Onset  . Heart disease Mother        2 stents  . Diabetes Mother   . Hyperlipidemia Mother   . Hypertension Mother   . Alcohol abuse Father   . Hyperlipidemia Brother     Social History   Socioeconomic History  . Marital status: Single    Spouse name: Not on file  . Number of children: 0  . Years of education: GTI  . Highest education level: Not on file  Occupational History  . 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 Smoking status: Former Smoker    Packs/day: 1.50    Years: 40.00    Pack years: 60.00    Types: Cigarettes    Start date: 12/01/1972    Quit date: 03/12/2012    Years since quitting: 7.7  . Smokeless tobacco: Never Used  Vaping Use  . Vaping Use: Never used  Substance and Sexual Activity  . Alcohol use: Yes    Alcohol/week: 1.0 standard drink    Types: 1 Standard drinks or equivalent per week    Comment: socially  . Drug use: No  . Sexual activity: Yes    Partners: Female  Other Topics Concern  . 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   Grew up in [place] and graduated from [employer].   Made living in [place].   Cared for his mother for 10 years and has ignored his own health.   Not seen by a doctor for 16 years.   Social Determinants of Health   Financial Resource Strain:   . Difficulty of Paying Living Expenses: Not on file  Food Insecurity:   . Worried About Programme researcher, broadcasting/film/video in the Last Year: Not on file  . Ran Out of Food in the Last Year: Not on file  Transportation Needs:   . Lack of Transportation (Medical): Not on file  . Lack of Transportation (Non-Medical): Not on file  Physical Activity:   . Days of Exercise per Week: Not on file  . Minutes of Exercise per Session: Not on file  Stress:   . Feeling of Stress : Not on file  Social Connections:   . Frequency of Communication with Friends and Family: Not on file  . Frequency of Social Gatherings with Friends and Family: Not on file  . Attends Religious Services: Not on file  . Active Member of Clubs or Organizations: Not on file  . Attends Banker Meetings: Not on file  . Marital Status: Not on file  Intimate Partner Violence:   . Fear of Current or Ex-Partner: Not on file  . Emotionally Abused: Not on file  . Physically Abused: Not on file  . Sexually  Abused: Not on file    Review of Systems  Constitutional: Negative for fatigue and fever.  HENT: Negative.   Eyes: Negative.   Respiratory: Positive for shortness of breath.   Cardiovascular: Negative.     Vitals:   12/22/19 1053  BP: 124/76  Pulse: 82  Temp: (!) 96.6 F (35.9 C)  SpO2: 96%     Physical Exam Constitutional:      Appearance: He is well-developed.  HENT:     Head: Normocephalic and atraumatic.     Mouth/Throat:     Mouth: Mucous membranes are moist.  Eyes:     General:        Right eye: No discharge.        Left eye: No discharge.  Neck:     Thyroid: No thyromegaly.     Trachea: No tracheal deviation.  Cardiovascular:     Rate and Rhythm: Normal rate and regular rhythm.  Pulmonary:     Effort: Pulmonary effort is normal. No respiratory distress.     Breath sounds: Normal breath sounds. No wheezing or rales.  Chest:     Chest wall: No tenderness.  Musculoskeletal:     Cervical back: No rigidity or tenderness.    Data Reviewed: Compliance report from his CPAP is not available at present-we will try and get a download from his DME company  Most recent PFT from 2018 revealed very severe obstructive disease  Assessment:  Chronic obstructive lung disease-stable status -Slight exacerbation recently -Off Trelegy for about a month, back on Trelegy now and expectation is that things will improve slowly  Obstructive sleep apnea-compliant with CPAP use  Plan/Recommendations:  Continue CPAP use -Encouraged to continue CPAP -We will get abdominal download  Continue use of Trelegy Albuterol as needed   Follow-up in 6 months  Call with any significant concerns   Virl Diamond MD [facility] 12/22/2019, 11:05 AM  CC: Dois Davenport, MD

## 2020-01-13 IMAGING — DX DG CHEST 2V
2 series · 2 of 2 positions shown · non-contrast
Comparison: Chest CT, 02/14/2018.  Chest radiographs, 06/26/2016.

CLINICAL DATA: Worsening cough.  History of COPD and hypertension.

EXAM:
CHEST - 2 VIEW

[chest pa]
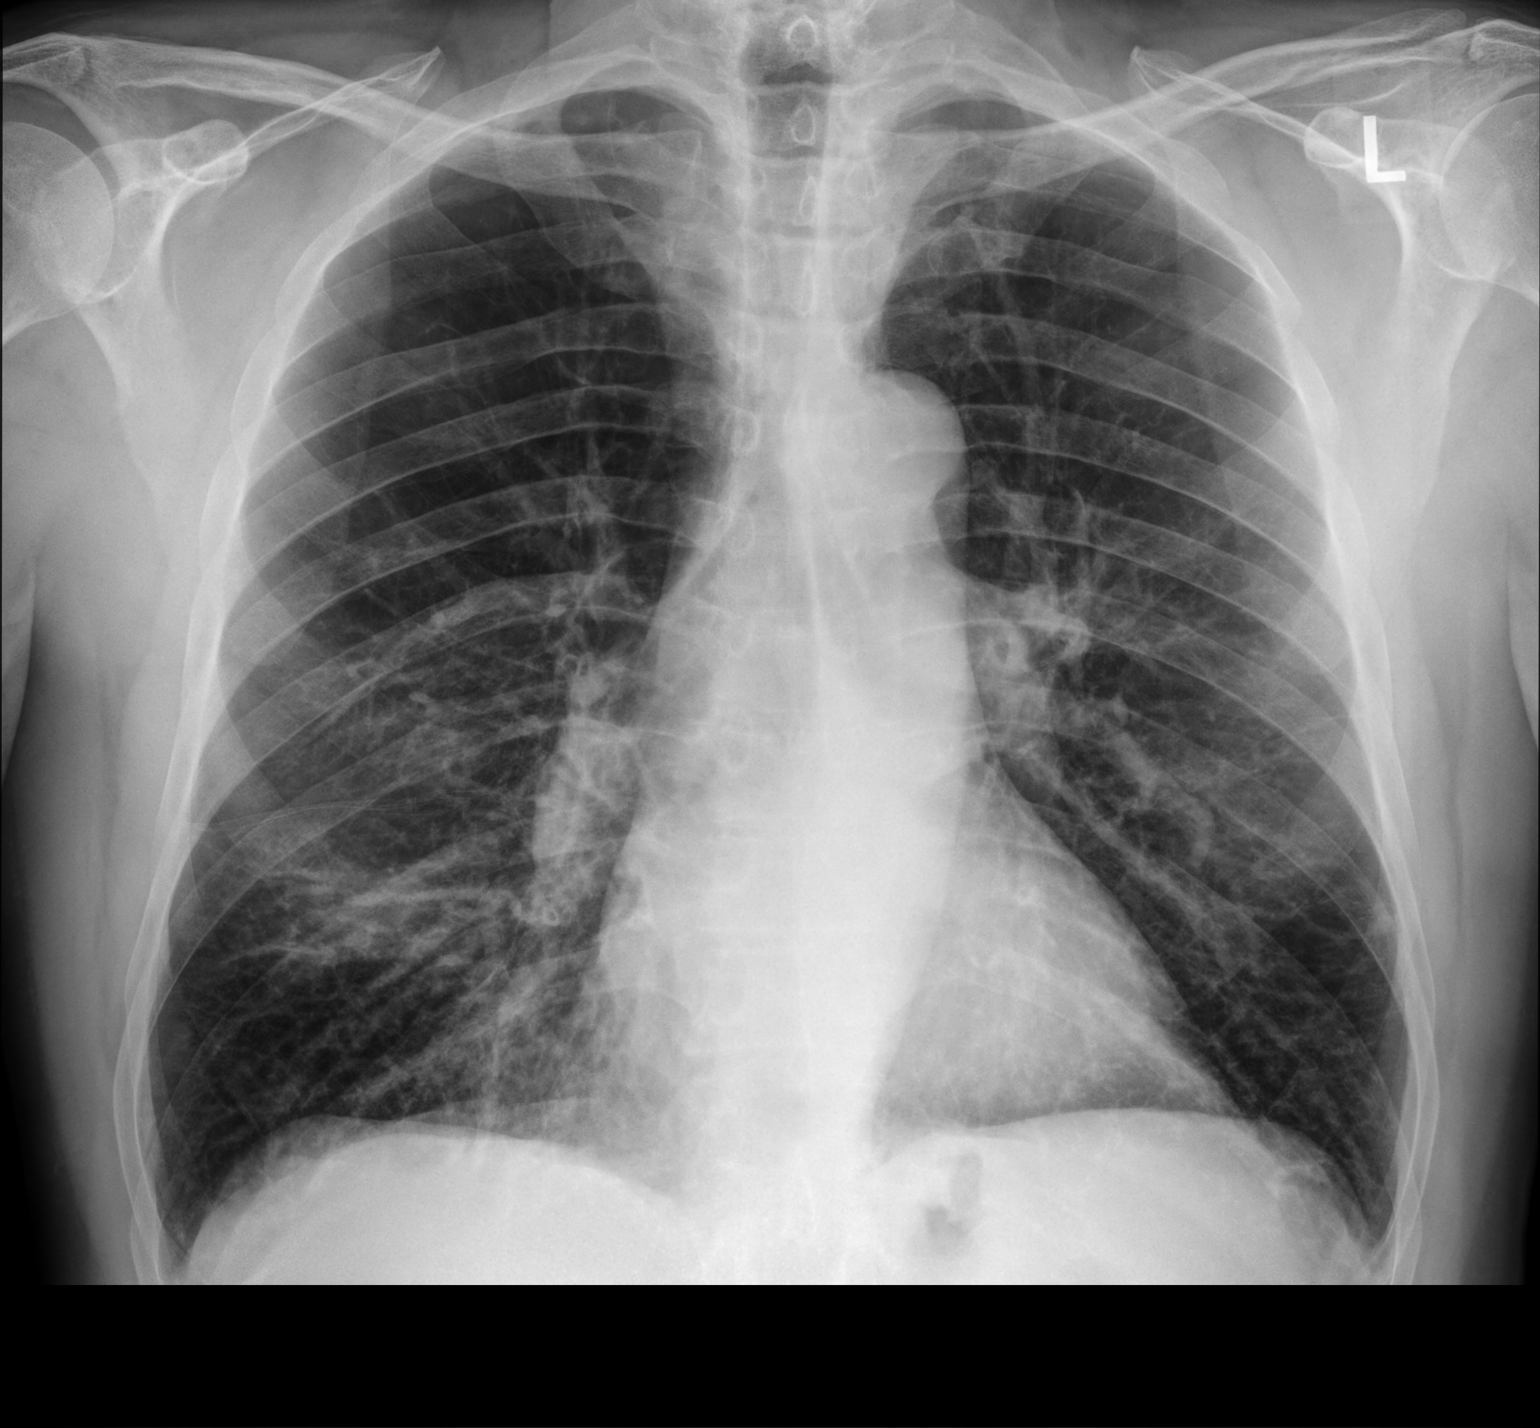

[chest lat]
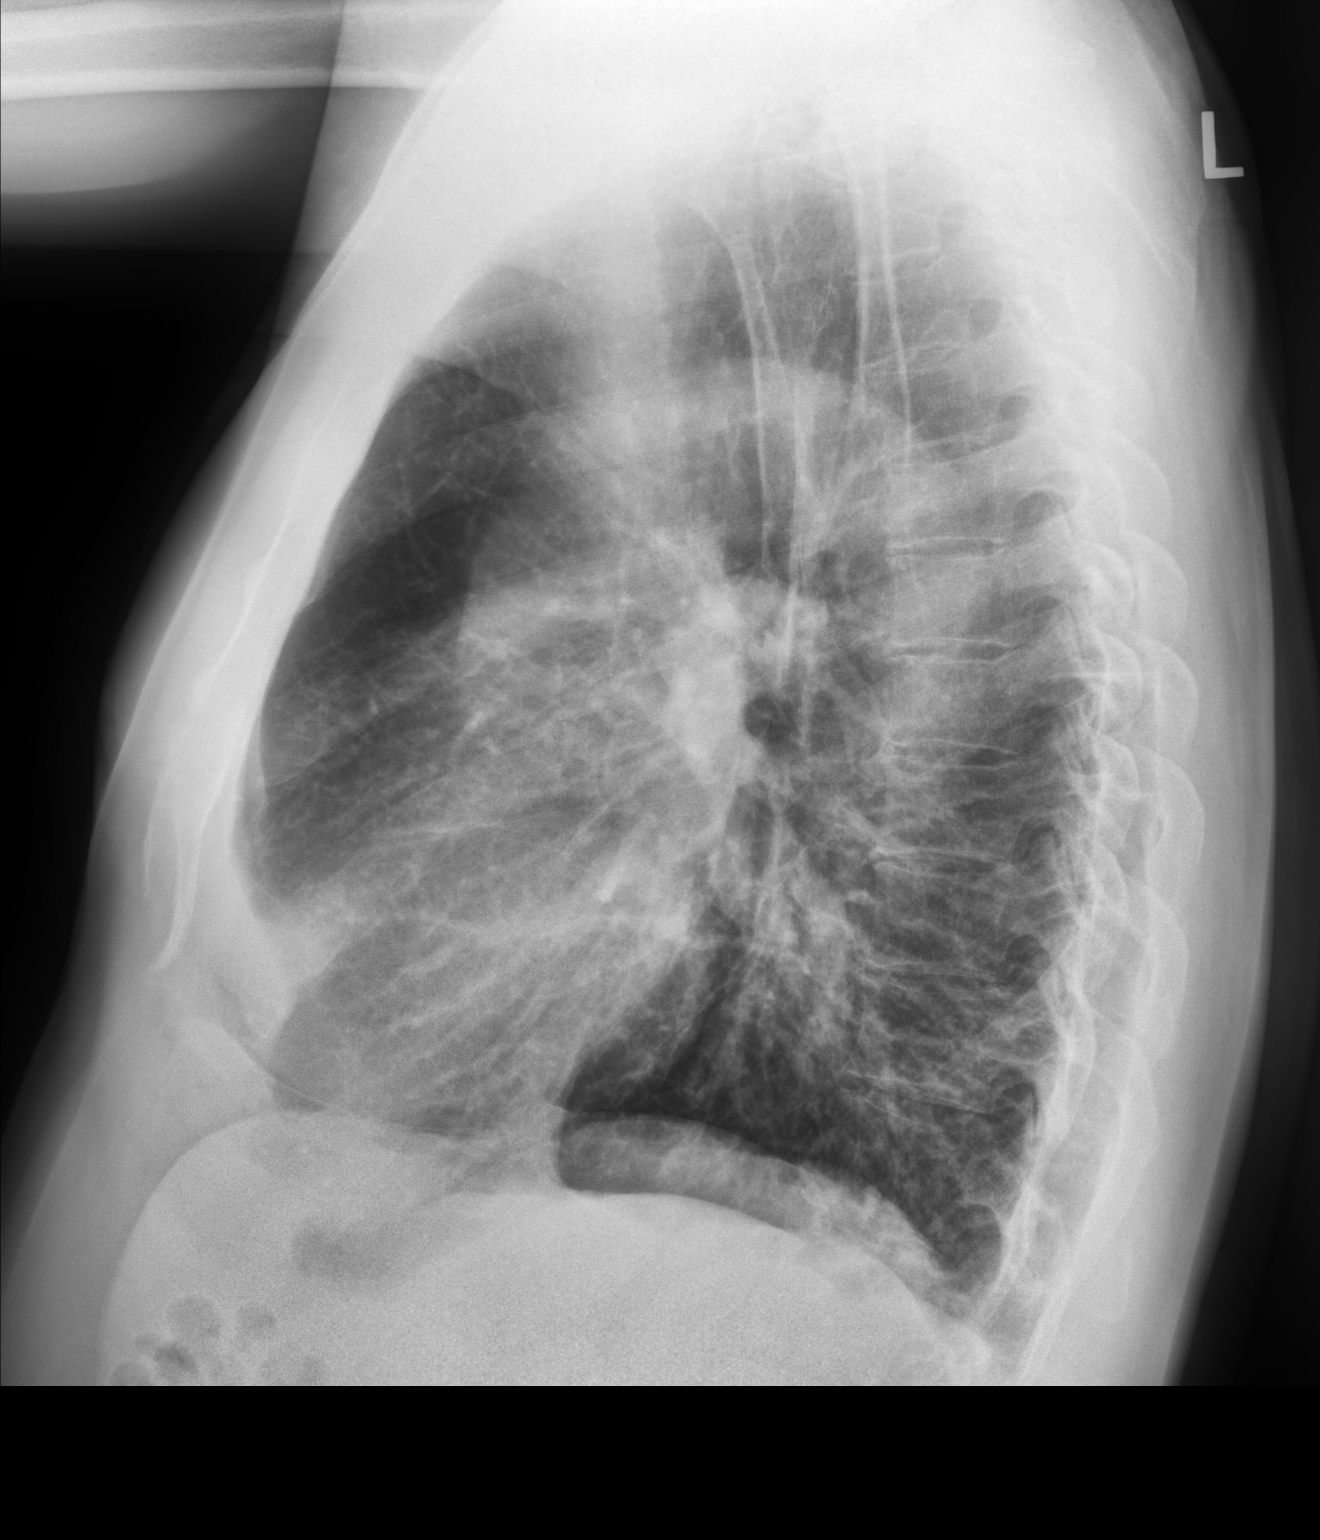

[2 of 2 positions shown; findings below may reference images not displayed]

FINDINGS: Cardiac silhouette is normal in size. No mediastinal or hilar
masses. No evidence of adenopathy.

Lungs are hyperexpanded. There are prominent bronchovascular
markings in the lower lungs with bronchial wall thickening in the
medial lower lobes, the latter finding increased from the prior
chest radiograph. There is lucency in the upper lobes consistent
with emphysema. No evidence of pneumonia or pulmonary edema.

No pleural effusion or pneumothorax.

Skeletal structures are intact.
IMPRESSION: 1. Bronchial wall thickening in the lower lobes appears increased
from the prior chest radiographs. Acute bronchitis should be
considered. There is no evidence of pneumonia or pulmonary edema.
2. Advanced emphysema/COPD.

## 2020-01-20 ENCOUNTER — Telehealth: Payer: Self-pay | Admitting: Acute Care

## 2020-01-20 NOTE — Telephone Encounter (Signed)
Please offer a visit with either Hunsucker or Dewald. Check with Dr. Val Eagle and make sure he is ok with the change. Let him know we will try another doctor to see if he feels that will improve his care.  Please have him come and pick up samples of Trelegy.  They can be left at the front desk.  Thanks

## 2020-01-20 NOTE — Telephone Encounter (Signed)
Called and spoke with pt who is wanting Judson Roch to call him personally as he wants to discuss some things with her.  Pt does not feel like he is getting the care that he deserves since BQ left office. Pt believes there have been some mistakes that have been made with his medical care. Pt said when he first met Dr. Jenetta Downer, pt was prescribed a med that was caught by pt's girlfriend that was a Marine scientist. The med that had been prescribed he said would've been a contraindication to another med that he is currently on.  Pt also said he had been out of the Trelegy for 30 days and breathing was getting worse due to him not having it. Pt said he was never told that he could've receive any samples of the inhaler.  Pt said after he had been out of the Trelegy for that long, he is not able to walk like he used to and he just does not feel like his old self anymore.  Pt would like SG to call him personally to further discuss things as pt is unhappy with how he has been being taken care of recently.  Routing to [employer]. Please advise.

## 2020-01-20 NOTE — Telephone Encounter (Signed)
Called and spoke with pt letting him know the info stated by Maralyn Sago and he verbalized understanding.  Pt stated that he did not need any Trelegy samples as he was currently okay  At this time.  Pt said that he does want to switch providers from Dr. Val Eagle to either Dr. Judeth Horn or Dr. Francine Graven.  Dr. Judeth Horn or Dr. Francine Graven, please advise if you are okay taking over pt's care and Dr. Val Eagle, please advise if you are okay with pt switching providers.

## 2020-01-21 NOTE — Telephone Encounter (Signed)
Hunsucker, Lesia Sago, MD  You 1 minute ago (4:00 PM)  MH I don't mind seeing if needed.   Called and spoke with pt about response from Dr. Judeth Horn. Pt said that he did want to switch. appt has been scheduled for pt with Dr. Judeth Horn 12/9. Nothing further needed.

## 2020-01-22 NOTE — Telephone Encounter (Signed)
Okay with me 

## 2020-02-03 ENCOUNTER — Other Ambulatory Visit: Payer: Self-pay | Admitting: Pulmonary Disease

## 2020-02-18 ENCOUNTER — Ambulatory Visit (INDEPENDENT_AMBULATORY_CARE_PROVIDER_SITE_OTHER): Payer: Medicaid Other | Admitting: Pulmonary Disease

## 2020-02-18 ENCOUNTER — Other Ambulatory Visit: Payer: Self-pay

## 2020-02-18 ENCOUNTER — Encounter: Payer: Self-pay | Admitting: Pulmonary Disease

## 2020-02-18 VITALS — BP 126/72 | HR 87 | Temp 97.1°F | Ht 72.0 in | Wt 203.0 lb

## 2020-02-18 DIAGNOSIS — J432 Centrilobular emphysema: Secondary | ICD-10-CM

## 2020-02-18 DIAGNOSIS — R0609 Other forms of dyspnea: Secondary | ICD-10-CM

## 2020-02-18 DIAGNOSIS — J9611 Chronic respiratory failure with hypoxia: Secondary | ICD-10-CM

## 2020-02-18 DIAGNOSIS — R0600 Dyspnea, unspecified: Secondary | ICD-10-CM | POA: Diagnosis not present

## 2020-02-18 NOTE — Patient Instructions (Signed)
Nice to meet you!  Continue Trelegy.  I think some of the breathing issues of late are related to losing what you gained on the track and your home gym. I sent a referral to pulmonary rehab. I think this will help get back to where you were a few months ago.  Return to clinic in 3 months.

## 2020-02-18 NOTE — Progress Notes (Signed)
Patient ID: Glenn Allison, male    DOB: [birth year], 64 y.o.   MRN: [id number]  Chief Complaint  Patient presents with  . COPD    Switching from Dr. Wynona Neat for continuity of care.     Referring provider: Dois Davenport, MD  HPI:   64 year old with severe COPD (FEV1 mid 55s 2018) and chronic hypoxemic respiratory failure whom I am seeing to establish care for COPD.  Reviewed multiple pulmonary notes from Dr. Kendrick Fries and Dr. Wynona Neat.  Had been on Treelgy and realtively stable for some months or more. In October 2021 he lost access to Trelegy - unclear but seems related to insurance change. Did not have inhaler for about 1 month. He noted significant worsening of DOE and onset of SOB at rest. Eventually regained access and has been using for 1 month. SOB at rest better, but DOE still present and worse than prior. Notes that he would usually exercise 6 days a week often walking 2 laps on a track. Did not do much physical activity for 3+ weeks. Has tried to resume last few weeks but still not doing much largely due to DOE. He is not sure if real or mental but having a hard time pushing through. He is frustrated by the setback and lack of progress. No wheezing, increase cough, etc to suggest exacerbation. Asks questions about valves for COPD.  Reviewed PFTs with patient with interpretation of severe COPD, sitting gas trapping, significant bronchodilator response.  Most recent CT chest 12/09/2018 with my interpretation barrel chest, no evidence of gas trapping, significant diffuse emphysema present throughout all lobes without predilection for upper lobes.  PMH: COPD, HTN Surgical History: LHC, none otherwise Family History: CAD, DM, HTN in mother Social History: former smoker, 60 pack years, retired   ACT:  No flowsheet data found.  MMRC: No flowsheet data found.  Epworth:  Results of the Epworth flowsheet 04/18/2018  Sitting and reading 0  Watching TV 1  Sitting, inactive in a public  place (e.g. a theatre or a meeting) 0  As a passenger in a car for an hour without a break 0  Lying down to rest in the afternoon when circumstances permit 3  Sitting and talking to someone 0  Sitting quietly after a lunch without alcohol 0  In a car, while stopped for a few minutes in traffic 0  Total score 4    Tests:   FENO:  No results found for: NITRICOXIDE  PFT: PFT Results Latest Ref Rng & Units 06/20/2016  FVC-Pre L 2.22  FVC-Predicted Pre % 51  FVC-Post L 2.62  FVC-Predicted Post % 60  Pre FEV1/FVC % % 28  Post FEV1/FCV % % 27  FEV1-Pre L 0.62  FEV1-Predicted Pre % 18  FEV1-Post L 0.70  DLCO uncorrected ml/min/mmHg 13.12  DLCO UNC% % 37  DLVA Predicted % 57  TLC L 8.07  TLC % Predicted % 109  RV % Predicted % 243    WALK:  SIX MIN WALK 08/01/2018 02/10/2018 12/11/2017 12/11/2017 03/01/2017  Supplimental Oxygen during Test? (L/min) Yes - Yes - Yes  O2 Flow Rate 3 - - - 3  Type Pulse - Continuous - Continuous  Tech Comments: Patient was able to complete walk at a slower than usual pace. Started the walk with SOB that increased during the walk. Denied any CP or body aches. Just SOB and fatigue. Patient was placed back on his 3L pulsed O2.  Patient when ambulation needs  3 L continuous to maintain oxygen saturations, patient on 5 pulsed drops to 86%, patient short of breath while walking//bjt Completed first lap with mild SOB and PO2 89% - added 2L O2 - On second lap PO2 89-90% with SOB noted. Increased to 3L O2 to finish walk with PO2 remaining at 94%. Karlton Lemon RN  concluded at lap 2 due to increased SOB and PO2 87% - Karlton Lemon RN desat during 6 minute walk, placed on 2:, dropped after 2nd lap, 3L set at kept pt at 93% TA/CMA    Imaging: No results found.  Lab Results:  CBC    Component Value Date/Time   WBC 9.2 07/19/2016 0822   RBC 4.98 07/19/2016 0822   HGB 15.3 07/19/2016 0822   HCT 45.0 07/19/2016 0822   PLT 279 07/19/2016 0822   MCV 90 07/19/2016  0822   MCH 30.7 07/19/2016 0822   MCHC 34.0 07/19/2016 0822   RDW 13.6 07/19/2016 0822   LYMPHSABS 3.1 07/19/2016 0822   EOSABS 0.1 07/19/2016 0822   BASOSABS 0.0 07/19/2016 0822    BMET    Component Value Date/Time   NA 138 08/14/2017 1054   K 4.4 08/14/2017 1054   CL 100 08/14/2017 1054   CO2 26 08/14/2017 1054   GLUCOSE 93 08/14/2017 1054   BUN 14 08/14/2017 1054   CREATININE 1.10 08/14/2017 1054   CALCIUM 9.3 08/14/2017 1054   GFRNONAA 73 08/14/2017 1054   GFRAA 84 08/14/2017 1054    BNP No results found for: BNP  ProBNP    Component Value Date/Time   PROBNP 33 06/15/2016 1039    Specialty Problems      Pulmonary Problems   COPD (chronic obstructive pulmonary disease) (HCC)    April 2018 alpha-1 antitrypsin normal      Chronic respiratory failure, unsp w hypoxia or hypercapnia (HCC)   OSA (obstructive sleep apnea)      No Known Allergies  Immunization History  Administered Date(s) Administered  . Pneumococcal Conjugate-13 10/12/2016  . Pneumococcal Polysaccharide-23 12/11/2017    Past Medical History:  Diagnosis Date  . COPD (chronic obstructive pulmonary disease) (HCC) 06/01/2016   Greater than 50-pack-year smoking history  . Hyperlipidemia   . Hypertension     Tobacco History: Social History   Tobacco Use  Smoking Status Former Smoker  . Packs/day: 1.50  . Years: 40.00  . Pack years: 60.00  . Types: Cigarettes  . Start date: 12/01/1972  . Quit date: 03/12/2012  . Years since quitting: 7.9  Smokeless Tobacco Never Used   Counseling given: Not Answered   Continue to not smoke  Outpatient Encounter Medications as of 02/18/2020  Medication Sig  . albuterol (PROAIR HFA) 108 (90 Base) MCG/ACT inhaler Inhale 1-2 puffs into the lungs every 6 (six) hours as needed for wheezing or shortness of breath.  Marland Kitchen albuterol (PROVENTIL) (2.5 MG/3ML) 0.083% nebulizer solution PLEASE SEE ATTACHED FOR DETAILED DIRECTIONS  . aspirin EC 81 MG tablet Take 1  tablet (81 mg total) by mouth daily.  Marland Kitchen atorvastatin (LIPITOR) 40 MG tablet TAKE 1 TABLET BY MOUTH EVERY DAY  . citalopram (CELEXA) 20 MG tablet Take 20 mg by mouth daily.  . clonazePAM (KLONOPIN) 0.5 MG tablet Take 1 tablet (0.5 mg total) by mouth 2 (two) times daily as needed for anxiety.  Marland Kitchen ezetimibe (ZETIA) 10 MG tablet TAKE 1 TABLET BY MOUTH EVERY DAY  . hydrochlorothiazide (MICROZIDE) 12.5 MG capsule Take 1 capsule (12.5 mg total) by mouth daily. Must  sch follow up appt for further refills [phone]  . metFORMIN (GLUCOPHAGE-XR) 500 MG 24 hr tablet Take 500 mg by mouth daily.  . metoprolol succinate (TOPROL-XL) 100 MG 24 hr tablet Take 1 tablet (100 mg total) by mouth daily. Take with or immediately following a meal. Overdue follow up appt must be scheduled for further refills  . nitroGLYCERIN (NITROSTAT) 0.4 MG SL tablet 1 tab sublingual for significant dyspnea, may repeat every 5 minutes x2 if not relieved  . TRELEGY ELLIPTA 100-62.5-25 MCG/INH AEPB INHALE 1 PUFF BY MOUTH EVERY DAY  . valsartan (DIOVAN) 80 MG tablet TAKE 1 TABLET BY MOUTH EVERY DAY  . Vitamin D, Ergocalciferol, (DRISDOL) 1.25 MG (50000 UT) CAPS capsule TAKE 1 CAPSULE BY MOUTH ONE TIME PER WEEK   No facility-administered encounter medications on file as of 02/18/2020.     Review of Systems  Review of Systems  n/a Physical Exam  BP 126/72 (BP Location: Right Arm, Cuff Size: Normal)   Pulse 87   Temp (!) 97.1 F (36.2 C) (Temporal)   Ht 6' (1.829 m)   Wt 203 lb (92.1 kg)   SpO2 95%   BMI 27.53 kg/m   Wt Readings from Last 5 Encounters:  02/18/20 203 lb (92.1 kg)  12/22/19 207 lb 12.8 oz (94.3 kg)  05/19/19 201 lb 3.2 oz (91.3 kg)  09/01/18 197 lb (89.4 kg)  08/01/18 192 lb 9.6 oz (87.4 kg)    BMI Readings from Last 5 Encounters:  02/18/20 27.53 kg/m  12/22/19 28.18 kg/m  05/19/19 27.29 kg/m  09/01/18 26.72 kg/m  08/01/18 26.12 kg/m     Physical Exam General: Well-appearing, in  wheelchair Eyes: EOMI, no icterus Neck: Supple no JVP Respiratory: Clear to station bilaterally, no wheezing, normal work of breathing on nasal cannula Cardiovascular: Regular rhythm, no murmurs Abdomen: Bowel sounds present, nondistended MSK: No synovitis, joint effusion Neuro: No weakness, sensation intact Psych: Normal mood, full affect   Assessment & Plan:   64 y.o. man with severe COPD whom we are seeing in follow up for COPD, DOE.   Severe COPD/asthma overlap: FEV1 mid 20s with significant bronchodilator response and 2018. No recent exacerbations. Unfortunately went without trelegy for 1 month recently. Continue Trelegy daily, has had some improvement. Uses daily as prescribed. Consider referral for advanced therapies   DOE: Worsened over last couple of months. In setting of being out of Trelegy for 1 month and decrease in physical activity. Suspect most likely element of deconditioning with decrease in exercise. Shared this with patient. Referral to pulmonary rehab sent as feel will be most beneficial.   Return in about 3 months (around 05/18/2020).   Karren Burly, MD 02/18/2020   This appointment required 52 minutes of patient care (this includes precharting, chart review, review of results, face-to-face care, etc.).

## 2020-02-25 ENCOUNTER — Encounter (HOSPITAL_COMMUNITY): Payer: Self-pay | Admitting: *Deleted

## 2020-02-25 NOTE — Progress Notes (Signed)
Received referral from Dr. Judeth Horn for this pt to participate in pulmonary rehab with the the diagnosis of Centrilobular emphysema. Clinical review of pt follow up appt to establish care on  12/9 Pulmonary office note. Also reviewed precious pulmonary appt with Dr. Sherri Rad and Dr.McQuaid as well as Dr. Katrinka Blazing - Cardiology  Pt with Covid Risk Score - 4. Pt appropriate for scheduling for Pulmonary rehab.  Will forward to support staff for scheduling and verification of insurance eligibility/benefits with pt consent. Alanson Aly, BSN Cardiac and Emergency planning/management officer

## 2020-03-23 ENCOUNTER — Telehealth (HOSPITAL_COMMUNITY): Payer: Self-pay

## 2020-03-23 NOTE — Telephone Encounter (Signed)
Pt insurance is active and benefits verified through [facility] Medicaid Co-pay 0, DED 0/0 met, out of pocket 0/0 met, co-insurance 0%. no pre-authorization required. Passport, [place] 03/23/2020_0 :30am, REF# [id number]

## 2020-04-11 ENCOUNTER — Telehealth: Payer: Self-pay | Admitting: Pulmonary Disease

## 2020-04-11 MED ORDER — ALBUTEROL SULFATE (2.5 MG/3ML) 0.083% IN NEBU: 2.5000 mg | INHALATION_SOLUTION | Freq: Four times a day (QID) | RESPIRATORY_TRACT | 12 refills | Status: DC | PRN

## 2020-04-11 NOTE — Telephone Encounter (Signed)
Called and spoke with pt. Verified med that needed to be sent to pharmacy and have sent pt's neb sol to preferred pharmacy for pt. Nothing further needed.

## 2020-05-09 ENCOUNTER — Ambulatory Visit: Payer: No Typology Code available for payment source | Admitting: Pulmonary Disease

## 2020-05-09 ENCOUNTER — Telehealth (HOSPITAL_COMMUNITY): Payer: Self-pay | Admitting: Family Medicine

## 2020-05-13 ENCOUNTER — Ambulatory Visit (HOSPITAL_COMMUNITY): Payer: No Typology Code available for payment source

## 2020-05-17 ENCOUNTER — Ambulatory Visit (HOSPITAL_COMMUNITY): Payer: No Typology Code available for payment source

## 2020-05-19 ENCOUNTER — Ambulatory Visit (HOSPITAL_COMMUNITY): Payer: No Typology Code available for payment source

## 2020-05-24 ENCOUNTER — Ambulatory Visit (HOSPITAL_COMMUNITY): Payer: No Typology Code available for payment source

## 2020-05-26 ENCOUNTER — Ambulatory Visit (HOSPITAL_COMMUNITY): Payer: No Typology Code available for payment source

## 2020-05-31 ENCOUNTER — Ambulatory Visit (HOSPITAL_COMMUNITY): Payer: No Typology Code available for payment source

## 2020-06-01 ENCOUNTER — Ambulatory Visit (INDEPENDENT_AMBULATORY_CARE_PROVIDER_SITE_OTHER): Payer: Medicaid Other | Admitting: Pulmonary Disease

## 2020-06-01 ENCOUNTER — Encounter: Payer: Self-pay | Admitting: Pulmonary Disease

## 2020-06-01 ENCOUNTER — Other Ambulatory Visit: Payer: Self-pay

## 2020-06-01 VITALS — BP 132/86 | HR 63 | Temp 98.1°F | Ht 72.0 in | Wt 204.0 lb

## 2020-06-01 DIAGNOSIS — J432 Centrilobular emphysema: Secondary | ICD-10-CM | POA: Diagnosis not present

## 2020-06-01 DIAGNOSIS — J9610 Chronic respiratory failure, unspecified whether with hypoxia or hypercapnia: Secondary | ICD-10-CM

## 2020-06-01 DIAGNOSIS — G4733 Obstructive sleep apnea (adult) (pediatric): Secondary | ICD-10-CM | POA: Diagnosis not present

## 2020-06-01 NOTE — Progress Notes (Signed)
Patient ID: BRITNEY CAPTAIN, male    DOB: [birth year], 65 y.o.   MRN: [id number]  Chief Complaint  Patient presents with  . Follow-up    No complaints currently    Referring provider: Dois Davenport, MD  HPI:   65 year old with severe COPD (FEV1 mid 52s 2018) and chronic hypoxemic respiratory failure whom I am seeing to establish care for COPD.  Reviewed multiple pulmonary notes from Dr. Kendrick Fries and Dr. Wynona Neat.  Doing ok. Adherent to Trelegy. breathing stable. Did not go to pulm rehab yet.  He is to work in his PCP for medications for mood.  He seems in the better place with additional medications.  He is eager to move forward with pulmonary rehab.  He is interested in pursuing [facility] valves for COPD.  Discussed pulmonary rehab is important for step towards this evaluation.  PMH: COPD, HTN Surgical History: LHC, none otherwise Family History: CAD, DM, HTN in mother Social History: former smoker, 60 pack years, retired   ACT:  No flowsheet data found.  MMRC: mMRC Dyspnea Scale mMRC Score  06/01/2020 3    Epworth:  Results of the Epworth flowsheet 04/18/2018  Sitting and reading 0  Watching TV 1  Sitting, inactive in a public place (e.g. a theatre or a meeting) 0  As a passenger in a car for an hour without a break 0  Lying down to rest in the afternoon when circumstances permit 3  Sitting and talking to someone 0  Sitting quietly after a lunch without alcohol 0  In a car, while stopped for a few minutes in traffic 0  Total score 4    Tests:   FENO:  No results found for: NITRICOXIDE  PFT: PFT Results Latest Ref Rng & Units 06/20/2016  FVC-Pre L 2.22  FVC-Predicted Pre % 51  FVC-Post L 2.62  FVC-Predicted Post % 60  Pre FEV1/FVC % % 28  Post FEV1/FCV % % 27  FEV1-Pre L 0.62  FEV1-Predicted Pre % 18  FEV1-Post L 0.70  DLCO uncorrected ml/min/mmHg 13.12  DLCO UNC% % 37  DLVA Predicted % 57  TLC L 8.07  TLC % Predicted % 109  RV % Predicted % 243  Personally  reviewed and interpreted as severe obstruction, FVC suggestive of gas trapping or restrictive disease, TLC within normal limits, RV to TLC ratio confirms gas trapping, DLCO severely reduced  WALK:  SIX MIN WALK 08/01/2018 02/10/2018 12/11/2017 12/11/2017 03/01/2017  Supplimental Oxygen during Test? (L/min) Yes - Yes - Yes  O2 Flow Rate 3 - - - 3  Type Pulse - Continuous - Continuous  Tech Comments: Patient was able to complete walk at a slower than usual pace. Started the walk with SOB that increased during the walk. Denied any CP or body aches. Just SOB and fatigue. Patient was placed back on his 3L pulsed O2.  Patient when ambulation needs 3 L continuous to maintain oxygen saturations, patient on 5 pulsed drops to 86%, patient short of breath while walking//bjt Completed first lap with mild SOB and PO2 89% - added 2L O2 - On second lap PO2 89-90% with SOB noted. Increased to 3L O2 to finish walk with PO2 remaining at 94%. Karlton Lemon RN  concluded at lap 2 due to increased SOB and PO2 87% - Karlton Lemon RN desat during 6 minute walk, placed on 2:, dropped after 2nd lap, 3L set at kept pt at 93% TA/CMA    Imaging: Personally reviewed  Lab Results: Personally reviewed CBC    Component Value Date/Time   WBC 9.2 07/19/2016 0822   RBC 4.98 07/19/2016 0822   HGB 15.3 07/19/2016 0822   HCT 45.0 07/19/2016 0822   PLT 279 07/19/2016 0822   MCV 90 07/19/2016 0822   MCH 30.7 07/19/2016 0822   MCHC 34.0 07/19/2016 0822   RDW 13.6 07/19/2016 0822   LYMPHSABS 3.1 07/19/2016 0822   EOSABS 0.1 07/19/2016 0822   BASOSABS 0.0 07/19/2016 0822    BMET    Component Value Date/Time   NA 138 08/14/2017 1054   K 4.4 08/14/2017 1054   CL 100 08/14/2017 1054   CO2 26 08/14/2017 1054   GLUCOSE 93 08/14/2017 1054   BUN 14 08/14/2017 1054   CREATININE 1.10 08/14/2017 1054   CALCIUM 9.3 08/14/2017 1054   GFRNONAA 73 08/14/2017 1054   GFRAA 84 08/14/2017 1054    BNP No results found for:  BNP  ProBNP    Component Value Date/Time   PROBNP 33 06/15/2016 1039    Specialty Problems      Pulmonary Problems   COPD (chronic obstructive pulmonary disease) (HCC)    April 2018 alpha-1 antitrypsin normal      Chronic respiratory failure, unsp w hypoxia or hypercapnia (HCC)   OSA (obstructive sleep apnea)      No Known Allergies  Immunization History  Administered Date(s) Administered  . Pneumococcal Conjugate-13 10/12/2016  . Pneumococcal Polysaccharide-23 12/11/2017    Past Medical History:  Diagnosis Date  . COPD (chronic obstructive pulmonary disease) (HCC) 06/01/2016   Greater than 50-pack-year smoking history  . Hyperlipidemia   . Hypertension     Tobacco History: Social History   Tobacco Use  Smoking Status Former Smoker  . Packs/day: 1.50  . Years: 40.00  . Pack years: 60.00  . Types: Cigarettes  . Start date: 12/01/1972  . Quit date: 03/12/2012  . Years since quitting: 8.2  Smokeless Tobacco Never Used   Counseling given: Not Answered   Continue to not smoke  Outpatient Encounter Medications as of 06/01/2020  Medication Sig  . albuterol (PROAIR HFA) 108 (90 Base) MCG/ACT inhaler Inhale 1-2 puffs into the lungs every 6 (six) hours as needed for wheezing or shortness of breath.  Marland Kitchen albuterol (PROVENTIL) (2.5 MG/3ML) 0.083% nebulizer solution Take 3 mLs (2.5 mg total) by nebulization every 6 (six) hours as needed for wheezing or shortness of breath.  Marland Kitchen aspirin EC 81 MG tablet Take 1 tablet (81 mg total) by mouth daily.  Marland Kitchen atorvastatin (LIPITOR) 40 MG tablet TAKE 1 TABLET BY MOUTH EVERY DAY  . citalopram (CELEXA) 20 MG tablet Take 20 mg by mouth daily.  . clonazePAM (KLONOPIN) 0.5 MG tablet Take 1 tablet (0.5 mg total) by mouth 2 (two) times daily as needed for anxiety.  . divalproex (DEPAKOTE ER) 250 MG 24 hr tablet Take 250 mg by mouth daily.  Marland Kitchen ezetimibe (ZETIA) 10 MG tablet TAKE 1 TABLET BY MOUTH EVERY DAY  . hydrochlorothiazide (MICROZIDE)  12.5 MG capsule Take 1 capsule (12.5 mg total) by mouth daily. Must sch follow up appt for further refills [phone]  . metFORMIN (GLUCOPHAGE-XR) 500 MG 24 hr tablet Take 500 mg by mouth daily.  . metoprolol succinate (TOPROL-XL) 100 MG 24 hr tablet Take 1 tablet (100 mg total) by mouth daily. Take with or immediately following a meal. Overdue follow up appt must be scheduled for further refills  . nitroGLYCERIN (NITROSTAT) 0.4 MG SL tablet 1 tab sublingual  for significant dyspnea, may repeat every 5 minutes x2 if not relieved  . TRELEGY ELLIPTA 100-62.5-25 MCG/INH AEPB INHALE 1 PUFF BY MOUTH EVERY DAY  . valsartan (DIOVAN) 80 MG tablet TAKE 1 TABLET BY MOUTH EVERY DAY  . Vitamin D, Ergocalciferol, (DRISDOL) 1.25 MG (50000 UT) CAPS capsule TAKE 1 CAPSULE BY MOUTH ONE TIME PER WEEK   No facility-administered encounter medications on file as of 06/01/2020.     Review of Systems  Review of Systems  n/a Physical Exam  BP 132/86 (BP Location: Left Arm, Cuff Size: Normal)   Pulse 63   Temp 98.1 F (36.7 C) (Temporal)   Ht 6' (1.829 m)   Wt 204 lb (92.5 kg)   SpO2 93% Comment: 3L Pulse O2  BMI 27.67 kg/m   Wt Readings from Last 5 Encounters:  06/01/20 204 lb (92.5 kg)  02/18/20 203 lb (92.1 kg)  12/22/19 207 lb 12.8 oz (94.3 kg)  05/19/19 201 lb 3.2 oz (91.3 kg)  09/01/18 197 lb (89.4 kg)    BMI Readings from Last 5 Encounters:  06/01/20 27.67 kg/m  02/18/20 27.53 kg/m  12/22/19 28.18 kg/m  05/19/19 27.29 kg/m  09/01/18 26.72 kg/m     Physical Exam General: Well-appearing, in wheelchair Eyes: EOMI, no icterus Neck: Supple no JVP Respiratory: Clear to station bilaterally, no wheezing, normal work of breathing on nasal cannula MSK: No synovitis, joint effusion Neuro: No weakness, sensation intact Psych: Normal mood, full affect   Assessment & Plan:   65 y.o. man with severe COPD whom we are seeing in follow up for COPD, DOE.   Severe COPD/asthma overlap: FEV1  mid 20s with significant bronchodilator response and 2018. No recent exacerbations.  Continue Trelegy.  Interested in [place] valves.  Discussed need for pulmonary rehab as below.   Chronic hypoxic respiratory failure: He is on 3 L via portable oxygen concentrator.  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  DOE: COPD, gas trapping, deconditioning.  Patient to inquire about starting pulmonary rehab soon..   Return in about 6 months (around 12/02/2020).   Karren Burly, MD 06/01/2020

## 2020-06-01 NOTE — Patient Instructions (Signed)
Nice to see you again  Let me know if the pulmonary rehab needs any paperwork for me.  It looks like you could call back and reschedule at any time.  This is an important first up towards considering the valves for COPD.  I hope you enjoy your upcoming concert  No medication changes  Return to clinic in 6 months for follow-up with Dr. Judeth Horn

## 2020-06-02 ENCOUNTER — Ambulatory Visit (HOSPITAL_COMMUNITY): Payer: No Typology Code available for payment source

## 2020-06-07 ENCOUNTER — Ambulatory Visit (HOSPITAL_COMMUNITY): Payer: No Typology Code available for payment source

## 2020-06-09 ENCOUNTER — Ambulatory Visit (HOSPITAL_COMMUNITY): Payer: No Typology Code available for payment source

## 2020-06-14 ENCOUNTER — Ambulatory Visit (HOSPITAL_COMMUNITY): Payer: No Typology Code available for payment source

## 2020-06-16 ENCOUNTER — Ambulatory Visit (HOSPITAL_COMMUNITY): Payer: No Typology Code available for payment source

## 2020-06-21 ENCOUNTER — Ambulatory Visit (HOSPITAL_COMMUNITY): Payer: No Typology Code available for payment source

## 2020-06-23 ENCOUNTER — Ambulatory Visit (HOSPITAL_COMMUNITY): Payer: No Typology Code available for payment source

## 2020-06-28 ENCOUNTER — Ambulatory Visit (HOSPITAL_COMMUNITY): Payer: No Typology Code available for payment source

## 2020-06-30 ENCOUNTER — Ambulatory Visit (HOSPITAL_COMMUNITY): Payer: No Typology Code available for payment source

## 2020-07-05 ENCOUNTER — Ambulatory Visit (HOSPITAL_COMMUNITY): Payer: No Typology Code available for payment source

## 2020-07-06 ENCOUNTER — Other Ambulatory Visit: Payer: Self-pay | Admitting: Interventional Cardiology

## 2020-08-04 ENCOUNTER — Other Ambulatory Visit: Payer: Self-pay | Admitting: Pulmonary Disease

## 2020-08-17 ENCOUNTER — Telehealth: Payer: Self-pay

## 2020-08-17 NOTE — Telephone Encounter (Signed)
Pharmacy sent notice that Trelegy is requiring a PA.   Listed below are medications that do not require a PA:  Advair Diskus Anoro Ellipta Dulera Flovent HFA Spiriva HandiHaler Spiriva Respimat Stiolto Respimat  If you are wanting patient to continue Trelegy, we need to initiate a PA. Please advise.  Thank you  covermymeds Key: [id number]

## 2020-08-22 ENCOUNTER — Other Ambulatory Visit: Payer: Self-pay | Admitting: Interventional Cardiology

## 2020-08-22 NOTE — Telephone Encounter (Signed)
Hunsucker, Lesia Sago, MD  Rivka Safer, CMA; [facility] Triage Pool 1 hour ago (1:19 PM)   MH  Yes, needs the PA. Trelegy is indicated given he has COPD/asthma overlap and needs ICS/LABA/LAMA therapy to control symptoms and minimize exacerbations. He has had less exacerbations and improved symptoms on Trelegy.   He has failed Advair, Spiriva, Incruse, Symbicort.

## 2020-08-22 NOTE — Telephone Encounter (Signed)
Attempted PA on CMM  Was alerted would need to complete this PA by calling [place] [facility]  Called [place] [facility] and was placed on hold for 8 min  Will try again 6/14

## 2020-08-24 MED ORDER — TRELEGY ELLIPTA 100-62.5-25 MCG/INH IN AEPB: 1.0000 | INHALATION_SPRAY | Freq: Every day | RESPIRATORY_TRACT | 0 refills | Status: DC

## 2020-08-24 NOTE — Telephone Encounter (Signed)
Called [place] [facility] at [phone] and was transferred over to the [employer]. Per Elease Hashimoto, the patient's plan terminated on 07/09/20 and she was showing a list of medical and pharmacy benefits claims that had been rejected.   Called and spoke with patient. He stated that he switched back to the generic Medicaid back in May 2022. I advised him that I would try to get the PA processed for him, he verbalized understanding. He also asked for a sample of Trelegy . I advised him that I would leave a sample at the front desk for him.   I have filled out the PA form and will fax it to [facility]. Will await response.

## 2020-09-02 MED ORDER — TRELEGY ELLIPTA 100-62.5-25 MCG/INH IN AEPB: 1.0000 | INHALATION_SPRAY | Freq: Every day | RESPIRATORY_TRACT | 0 refills | Status: DC

## 2020-09-02 NOTE — Telephone Encounter (Signed)
I have not received the signed PA form back from [facility] yet. Will check with MH this afternoon as he is in clinic today.

## 2020-09-02 NOTE — Telephone Encounter (Signed)
I called and spoke with pt and he is aware of sample of trelegy has been placed up front and he will come by on Monday to pick this up.    Cherina have you heard anything else from [facility] about the PA?  Thanks

## 2020-09-07 ENCOUNTER — Other Ambulatory Visit: Payer: Self-pay | Admitting: Interventional Cardiology

## 2020-09-09 NOTE — Telephone Encounter (Signed)
Per Cherina- PA form was faxed to [place] tracks earlier this week.  I have contacted [place] [facility] at [phone] and spoke with Trula Ore, who stated that PA was approved until 08/31/2021.  PA # [id number]  Patient is aware and voiced his understanding.

## 2020-09-09 NOTE — Telephone Encounter (Signed)
Patient checking on the PA for Trelegy. Patient phone number is [phone].

## 2020-09-22 ENCOUNTER — Other Ambulatory Visit: Payer: Self-pay

## 2020-09-22 NOTE — Telephone Encounter (Signed)
Called pt and advised he will need to go through his PCP office for further refills. Pt agreeable and appreciates the follow up call

## 2021-02-13 ENCOUNTER — Other Ambulatory Visit (HOSPITAL_COMMUNITY): Payer: Self-pay

## 2021-02-16 ENCOUNTER — Telehealth: Payer: Self-pay | Admitting: Pulmonary Disease

## 2021-02-17 MED ORDER — TRELEGY ELLIPTA 100-62.5-25 MCG/ACT IN AEPB: 1.0000 | INHALATION_SPRAY | Freq: Every day | RESPIRATORY_TRACT | 0 refills | Status: DC

## 2021-02-17 NOTE — Telephone Encounter (Signed)
Spoke with pt  Rx for trelegy was sent  I have scheduled him rov since he was overdue  Nothing further needed

## 2021-02-20 ENCOUNTER — Telehealth: Payer: Self-pay

## 2021-02-20 ENCOUNTER — Other Ambulatory Visit (HOSPITAL_COMMUNITY): Payer: Self-pay

## 2021-02-20 ENCOUNTER — Telehealth: Payer: Self-pay | Admitting: Pulmonary Disease

## 2021-02-20 MED ORDER — TRELEGY ELLIPTA 100-62.5-25 MCG/ACT IN AEPB: 1.0000 | INHALATION_SPRAY | Freq: Every day | RESPIRATORY_TRACT | 2 refills | Status: DC

## 2021-02-20 MED ORDER — ALBUTEROL SULFATE HFA 108 (90 BASE) MCG/ACT IN AERS: 1.0000 | INHALATION_SPRAY | Freq: Four times a day (QID) | RESPIRATORY_TRACT | 4 refills | Status: DC | PRN

## 2021-02-20 NOTE — Telephone Encounter (Signed)
Patient Advocate Encounter  Prior Authorization for Trelegy Salomon Mast has been approved.    PA# [id number]  Effective dates: 02/20/2021 through 08/19/2021  Per Test Claim Patients co-pay is N/A   Spoke with Pharmacy to Process.  Patient Advocate Fax:  [phone]

## 2021-02-20 NOTE — Telephone Encounter (Signed)
Patient Advocate Encounter   Received notification from [facility] that prior authorization for Trelegy Salomon Mast is required by his/her insurance [place] Medicaid.   PA submitted on 02/20/2021  Conf. #: [id number]  Status is pending    [facility] will continue to follow:  Patient Advocate Fax:  [phone]

## 2021-02-20 NOTE — Telephone Encounter (Signed)
Call made to patient, confirmed DOB. Made aware refills have been sent in. Voiced understanding.   Nothing further needed at this time.

## 2021-03-07 ENCOUNTER — Other Ambulatory Visit: Payer: Self-pay

## 2021-03-07 ENCOUNTER — Encounter: Payer: Self-pay | Admitting: Pulmonary Disease

## 2021-03-07 ENCOUNTER — Ambulatory Visit (INDEPENDENT_AMBULATORY_CARE_PROVIDER_SITE_OTHER): Payer: Medicaid Other | Admitting: Pulmonary Disease

## 2021-03-07 VITALS — BP 122/76 | HR 72 | Temp 98.2°F | Ht 72.0 in | Wt 214.0 lb

## 2021-03-07 DIAGNOSIS — J9610 Chronic respiratory failure, unspecified whether with hypoxia or hypercapnia: Secondary | ICD-10-CM

## 2021-03-07 DIAGNOSIS — J432 Centrilobular emphysema: Secondary | ICD-10-CM | POA: Diagnosis not present

## 2021-03-07 NOTE — Progress Notes (Signed)
Patient ID: Glenn Allison, male    DOB: 02-21-1956, 65 y.o.   MRN: [id number]  Chief Complaint  Patient presents with   Follow-up    Pt states that he wheezes more then before. And he has gained some weight. Wheezing does go away after awhile. Wheezing started happening a few months ago.     Referring provider: Dois Davenport, MD  HPI:   65 y.o. with severe COPD (FEV1 mid 20s 2018) and chronic hypoxemic respiratory failure whom I am seeing in follow up for COPD.    Doing ok. Adherent to Trelegy. Breathing stable. Did not go to pulm rehab yet.  Visited and felt too much. Discussed using his elliptical more frequently, gradually increasing use to improve cardiovascular endurance.  PMH: COPD, HTN Surgical History: LHC, none otherwise Family History: CAD, DM, HTN in mother Social History: former smoker, 60 pack years, retired   ACT:  No flowsheet data found.  MMRC: mMRC Dyspnea Scale mMRC Score  06/01/2020 3    Epworth:  Results of the Epworth flowsheet 04/18/2018  Sitting and reading 0  Watching TV 1  Sitting, inactive in a public place (e.g. a theatre or a meeting) 0  As a passenger in a car for an hour without a break 0  Lying down to rest in the afternoon when circumstances permit 3  Sitting and talking to someone 0  Sitting quietly after a lunch without alcohol 0  In a car, while stopped for a few minutes in traffic 0  Total score 4    Tests:   FENO:  No results found for: NITRICOXIDE  PFT: PFT Results Latest Ref Rng & Units 06/20/2016  FVC-Pre L 2.22  FVC-Predicted Pre % 51  FVC-Post L 2.62  FVC-Predicted Post % 60  Pre FEV1/FVC % % 28  Post FEV1/FCV % % 27  FEV1-Pre L 0.62  FEV1-Predicted Pre % 18  FEV1-Post L 0.70  DLCO uncorrected ml/min/mmHg 13.12  DLCO UNC% % 37  DLVA Predicted % 57  TLC L 8.07  TLC % Predicted % 109  RV % Predicted % 243  Personally reviewed and interpreted as severe obstruction, FVC suggestive of gas trapping or restrictive  disease, TLC within normal limits, RV to TLC ratio confirms gas trapping, DLCO severely reduced  WALK:  SIX MIN WALK 08/01/2018 02/10/2018 12/11/2017 12/11/2017 03/01/2017  Supplimental Oxygen during Test? (L/min) Yes - Yes - Yes  O2 Flow Rate 3 - - - 3  Type Pulse - Continuous - Continuous  Tech Comments: Patient was able to complete walk at a slower than usual pace. Started the walk with SOB that increased during the walk. Denied any CP or body aches. Just SOB and fatigue. Patient was placed back on his 3L pulsed O2.  Patient when ambulation needs 3 L continuous to maintain oxygen saturations, patient on 5 pulsed drops to 86%, patient short of breath while walking//bjt Completed first lap with mild SOB and PO2 89% - added 2L O2 - On second lap PO2 89-90% with SOB noted. Increased to 3L O2 to finish walk with PO2 remaining at 94%. Karlton Lemon RN  concluded at lap 2 due to increased SOB and PO2 87% - Karlton Lemon RN desat during 6 minute walk, placed on 2:, dropped after 2nd lap, 3L set at kept pt at 93% TA/CMA    Imaging: Personally reviewed  Lab Results: Personally reviewed CBC    Component Value Date/Time   WBC 9.2 07/19/2016 0822  RBC 4.98 07/19/2016 0822   HGB 15.3 07/19/2016 0822   HCT 45.0 07/19/2016 0822   PLT 279 07/19/2016 0822   MCV 90 07/19/2016 0822   MCH 30.7 07/19/2016 0822   MCHC 34.0 07/19/2016 0822   RDW 13.6 07/19/2016 0822   LYMPHSABS 3.1 07/19/2016 0822   EOSABS 0.1 07/19/2016 0822   BASOSABS 0.0 07/19/2016 0822    BMET    Component Value Date/Time   NA 138 08/14/2017 1054   K 4.4 08/14/2017 1054   CL 100 08/14/2017 1054   CO2 26 08/14/2017 1054   GLUCOSE 93 08/14/2017 1054   BUN 14 08/14/2017 1054   CREATININE 1.10 08/14/2017 1054   CALCIUM 9.3 08/14/2017 1054   GFRNONAA 73 08/14/2017 1054   GFRAA 84 08/14/2017 1054    BNP No results found for: BNP  ProBNP    Component Value Date/Time   PROBNP 33 06/15/2016 1039    Specialty Problems        Pulmonary Problems   COPD (chronic obstructive pulmonary disease) (HCC)    April 2018 alpha-1 antitrypsin normal      Chronic respiratory failure, unsp w hypoxia or hypercapnia (HCC)   OSA (obstructive sleep apnea)    No Known Allergies  Immunization History  Administered Date(s) Administered   Pneumococcal Conjugate-13 10/12/2016   Pneumococcal Polysaccharide-23 12/11/2017    Past Medical History:  Diagnosis Date   COPD (chronic obstructive pulmonary disease) (HCC) 06/01/2016   Greater than 50-pack-year smoking history   Hyperlipidemia    Hypertension     Tobacco History: Social History   Tobacco Use  Smoking Status Former   Packs/day: 1.50   Years: 40.00   Pack years: 60.00   Types: Cigarettes   Start date: 12/01/1972   Quit date: 03/12/2012   Years since quitting: 8.9  Smokeless Tobacco Never   Counseling given: Not Answered   Continue to not smoke  Outpatient Encounter Medications as of 03/07/2021  Medication Sig   albuterol (PROAIR HFA) 108 (90 Base) MCG/ACT inhaler Inhale 1-2 puffs into the lungs every 6 (six) hours as needed for wheezing or shortness of breath.   albuterol (PROVENTIL) (2.5 MG/3ML) 0.083% nebulizer solution Take 3 mLs (2.5 mg total) by nebulization every 6 (six) hours as needed for wheezing or shortness of breath.   ARIPiprazole (ABILIFY) 2 MG tablet Take 2 mg by mouth daily.   aspirin EC 81 MG tablet Take 1 tablet (81 mg total) by mouth daily.   atorvastatin (LIPITOR) 40 MG tablet TAKE 1 TABLET BY MOUTH EVERY DAY   citalopram (CELEXA) 20 MG tablet Take 20 mg by mouth daily.   divalproex (DEPAKOTE ER) 250 MG 24 hr tablet Take 250 mg by mouth daily.   ezetimibe (ZETIA) 10 MG tablet TAKE 1 TABLET BY MOUTH EVERY DAY   fluticasone (FLONASE) 50 MCG/ACT nasal spray Place 2 sprays into both nostrils daily.   Fluticasone-Umeclidin-Vilant (TRELEGY ELLIPTA) 100-62.5-25 MCG/INH AEPB Inhale 1 puff into the lungs daily.   hydrochlorothiazide  (MICROZIDE) 12.5 MG capsule Take 1 capsule (12.5 mg total) by mouth daily. Please make overdue appt with Dr. Katrinka Blazing before anymore refills. Thank you 3rd and Final attempt   loratadine (CLARITIN) 10 MG tablet Take 10 mg by mouth daily.   metFORMIN (GLUCOPHAGE-XR) 500 MG 24 hr tablet Take 500 mg by mouth daily.   metoprolol succinate (TOPROL-XL) 100 MG 24 hr tablet Take 1 tablet (100 mg total) by mouth daily. Take with or immediately following a meal. Overdue follow up  appt must be scheduled for further refills   nitroGLYCERIN (NITROSTAT) 0.4 MG SL tablet 1 tab sublingual for significant dyspnea, may repeat every 5 minutes x2 if not relieved   TRELEGY ELLIPTA 100-62.5-25 MCG/INH AEPB INHALE 1 PUFF BY MOUTH EVERY DAY   valsartan (DIOVAN) 80 MG tablet TAKE 1 TABLET BY MOUTH EVERY DAY   Vitamin D, Ergocalciferol, (DRISDOL) 1.25 MG (50000 UT) CAPS capsule TAKE 1 CAPSULE BY MOUTH ONE TIME PER WEEK   [DISCONTINUED] Fluticasone-Umeclidin-Vilant (TRELEGY ELLIPTA) 100-62.5-25 MCG/ACT AEPB Inhale 1 puff into the lungs daily.   [DISCONTINUED] Fluticasone-Umeclidin-Vilant (TRELEGY ELLIPTA) 100-62.5-25 MCG/INH AEPB Inhale 1 puff into the lungs daily.   clonazePAM (KLONOPIN) 0.5 MG tablet Take 1 tablet (0.5 mg total) by mouth 2 (two) times daily as needed for anxiety.   No facility-administered encounter medications on file as of 03/07/2021.     Review of Systems  Review of Systems  n/a Physical Exam  BP 122/76 (BP Location: Left Arm, Patient Position: Sitting, Cuff Size: Normal)    Pulse 72    Temp 98.2 F (36.8 C) (Oral)    Ht 6' (1.829 m)    Wt 214 lb (97.1 kg)    SpO2 93%    BMI 29.02 kg/m   Wt Readings from Last 5 Encounters:  03/07/21 214 lb (97.1 kg)  06/01/20 204 lb (92.5 kg)  02/18/20 203 lb (92.1 kg)  12/22/19 207 lb 12.8 oz (94.3 kg)  05/19/19 201 lb 3.2 oz (91.3 kg)    BMI Readings from Last 5 Encounters:  03/07/21 29.02 kg/m  06/01/20 27.67 kg/m  02/18/20 27.53 kg/m  12/22/19  28.18 kg/m  05/19/19 27.29 kg/m     Physical Exam General: Well-appearing, in wheelchair Eyes: EOMI, no icterus Neck: Supple no JVP Respiratory: Clear to auscultation bilaterally, no wheezing, normal work of breathing on nasal cannula MSK: No synovitis, joint effusion Neuro: No weakness, sensation intact Psych: Normal mood, full affect   Assessment & Plan:   65 y.o. man with severe COPD whom we are seeing in follow up for COPD, DOE.   Severe COPD/asthma overlap: FEV1 mid 20s with significant bronchodilator response and 2018. No recent exacerbations.  Continue Trelegy.    Chronic hypoxic respiratory failure: He is on 3 L via portable oxygen concentrator.  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  DOE: COPD, gas trapping, deconditioning.  Encouraged graduated exercise program at home.   Return in about 6 months (around 09/05/2021).  I spent 33 minutes in the care of the patient including face-to-face visit, review of records, coordination of care.  Karren Burly, MD 03/07/2021

## 2021-03-07 NOTE — Patient Instructions (Addendum)
Nice to see you again  No changes to medications   Try gradually increasing time on elliptical by 2-3 minutes every couple of weeks. Try to use it every week day, take breaks on the weekend.  Return to clinic in 6 months or sooner

## 2021-04-18 ENCOUNTER — Other Ambulatory Visit: Payer: Self-pay | Admitting: Pulmonary Disease

## 2021-07-14 ENCOUNTER — Telehealth: Payer: Self-pay | Admitting: Pulmonary Disease

## 2021-07-14 MED ORDER — TRELEGY ELLIPTA 100-62.5-25 MCG/ACT IN AEPB: 1.0000 | INHALATION_SPRAY | Freq: Every day | RESPIRATORY_TRACT | 11 refills | Status: DC

## 2021-07-14 NOTE — Telephone Encounter (Signed)
Called and spoke with patient who is requesting a refill of his Trelegy inhaler. Patient verified preferred pharmacy. RX has been sent and patient has been scheduled for 6 month follow up. Nothing further needed at this time. ?

## 2021-07-19 ENCOUNTER — Other Ambulatory Visit (HOSPITAL_COMMUNITY): Payer: Self-pay

## 2021-07-19 ENCOUNTER — Telehealth: Payer: Self-pay

## 2021-07-19 ENCOUNTER — Telehealth: Payer: Self-pay | Admitting: Pulmonary Disease

## 2021-07-19 NOTE — Telephone Encounter (Signed)
[employer] is stating the this patient Telegy is requiring a prior [place].  ? ?Can we get this started for this patient when you have time. ? ?Thank you  ?

## 2021-07-19 NOTE — Telephone Encounter (Signed)
Patient Advocate Encounter ? ?Prior Authorization for Trelegy Ellipta 100-62.5-25MCG/ACT aerosol powder has been approved.   ? ?PA# [id number] ?Effective dates: 07/19/2021 through 07/20/2022 ? ?Patients co-pay is $4.  ? ?

## 2021-07-19 NOTE — Telephone Encounter (Signed)
Prior Auth sent for Trelegy. I will update chart note.

## 2021-07-19 NOTE — Telephone Encounter (Signed)
Patient Advocate Encounter ?  ?Received notification hat prior authorization for Trelegy Ellipta 100-62.5-25MCG/ACT aerosol powder is required. ?  ?PA submitted on 07/19/2021 ?Key : [id number] ?Status is pending ?

## 2021-07-26 ENCOUNTER — Telehealth (INDEPENDENT_AMBULATORY_CARE_PROVIDER_SITE_OTHER): Payer: Self-pay | Admitting: Pulmonary Disease

## 2021-07-26 ENCOUNTER — Telehealth: Payer: Self-pay | Admitting: Pulmonary Disease

## 2021-07-26 ENCOUNTER — Encounter: Payer: Self-pay | Admitting: Pulmonary Disease

## 2021-07-26 DIAGNOSIS — J432 Centrilobular emphysema: Secondary | ICD-10-CM

## 2021-07-26 DIAGNOSIS — J9610 Chronic respiratory failure, unspecified whether with hypoxia or hypercapnia: Secondary | ICD-10-CM

## 2021-07-26 MED ORDER — PREDNISONE 20 MG PO TABS: 40.0000 mg | ORAL_TABLET | Freq: Every day | ORAL | 0 refills | Status: AC

## 2021-07-26 MED ORDER — DOXYCYCLINE HYCLATE 100 MG PO TABS: 100.0000 mg | ORAL_TABLET | Freq: Two times a day (BID) | ORAL | 0 refills | Status: DC

## 2021-07-26 NOTE — Telephone Encounter (Signed)
Called and spoke to pt. Pt is wanting to change his in office visit today to a video visit. Pt is scheduled to see Dr. Silas Flood today at 12pm. Pt is requesting a video visit instead of in office because he recently had a death in his family and is feeling more anxious than normal. Pt does not want to leave the house.  ? ? ?Dr. Silas Flood, please advise if this is ok to change today's visit to video. Thanks.  ?

## 2021-07-26 NOTE — Telephone Encounter (Signed)
Ok with me - no probelm

## 2021-07-26 NOTE — Progress Notes (Signed)
Virtual Visit via Video Note  I connected with Glenn Allison on 08/17/21 at 12:00 PM EDT by a video enabled telemedicine application and verified that I am speaking with the correct person using two identifiers.  Location: Patient: Home Provider: Office - [facility] - [address], [address], [place], [place] [address]  I discussed the limitations of evaluation and management by telemedicine and the availability of in person appointments. The patient expressed understanding and agreed to proceed. I also discussed with the patient that there may be a patient responsible charge related to this service. The patient expressed understanding and agreed to proceed.  Patient consented to consult via telephone: Yes People present and their role in pt care: Pt   History of Present Illness:  66 y.o. man with COPD/asthma overlap presenting for routine follow up.   Doing ok. Breathing a bit more labored. Cough a bit worse. Recent death in family otherwise would've called sooner. Thinks maybe weather is contributing. Denies fever, worsening hypoxemia, etc.    Chief complaint:   acute on chronic DOE  Observations/Objective:  Social History   Tobacco Use  Smoking Status Former   Packs/day: 1.50   Years: 40.00   Total pack years: 60.00   Types: Cigarettes   Start date: 12/01/1972   Quit date: 03/12/2012   Years since quitting: 9.4  Smokeless Tobacco Never   Immunization History  Administered Date(s) Administered   Pneumococcal Conjugate-13 10/12/2016   Pneumococcal Polysaccharide-23 12/11/2017    Assessment and Plan:  Severe COPD/asthma overlap: FEV1 mid 20s with significant bronchodilator response and 2018. No recent exacerbations.  Continue Trelegy. Prednisone and doxycycline provided.   Chronic hypoxic respiratory failure: He is on 3 L pulsed via portable oxygen. 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 Will place order for POC.   DOE: COPD, gas trapping, deconditioning.  Encouraged  graduated exercise program at home.  Follow Up Instructions:  Return in about 3 months (around 10/26/2021).    I discussed the assessment and treatment plan with the patient. The patient was provided an opportunity to ask questions and all were answered. The patient agreed with the plan and demonstrated an understanding of the instructions.   The patient was advised to call back or seek an in-person evaluation if the symptoms worsen or if the condition fails to improve as anticipated.  I spent 22 minutes in face to face visit via video conference.   Karren Burly, MD

## 2021-07-26 NOTE — Telephone Encounter (Signed)
Called and spoke with pt letting him know that MH was fine with his appt being changed to a mychart visit and he verbalized understanding. Appt has been changed. Nothing further needed. ?

## 2021-09-29 ENCOUNTER — Encounter: Payer: Self-pay | Admitting: Pulmonary Disease

## 2021-09-29 NOTE — Telephone Encounter (Signed)
Ok to be excused, if my signature is needed can complete next week.

## 2021-10-13 ENCOUNTER — Other Ambulatory Visit: Payer: Self-pay | Admitting: *Deleted

## 2021-10-13 MED ORDER — ALBUTEROL SULFATE HFA 108 (90 BASE) MCG/ACT IN AERS: 1.0000 | INHALATION_SPRAY | Freq: Four times a day (QID) | RESPIRATORY_TRACT | 5 refills | Status: DC | PRN

## 2022-02-13 ENCOUNTER — Encounter: Payer: Self-pay | Admitting: Pulmonary Disease

## 2022-02-13 ENCOUNTER — Ambulatory Visit (INDEPENDENT_AMBULATORY_CARE_PROVIDER_SITE_OTHER): Payer: Medicare Other | Admitting: Pulmonary Disease

## 2022-02-13 VITALS — HR 85 | Ht 72.0 in | Wt 204.0 lb

## 2022-02-13 DIAGNOSIS — J9610 Chronic respiratory failure, unspecified whether with hypoxia or hypercapnia: Secondary | ICD-10-CM | POA: Diagnosis not present

## 2022-02-13 DIAGNOSIS — J432 Centrilobular emphysema: Secondary | ICD-10-CM

## 2022-02-13 NOTE — Patient Instructions (Addendum)
Nice to see you again  Lets try the higher dose of Trelegy.  This is the 200 mcg dose.  It is still 1 puff once a day.  I provided samples for a few weeks.  If this higher dose seems to be more helpful let me know and I will send a new prescription in for this.  If not, we can return to using the lower dose, the 1 that you are prescribed, the 100 mcg dose.  Return to clinic in 6 months or sooner as needed with Dr. Judeth Horn

## 2022-02-13 NOTE — Progress Notes (Signed)
Patient ID: Glenn Allison, male    DOB: 02-04-1956, 66 y.o.   MRN: [id number]  Chief Complaint  Patient presents with   Follow-up    Pt is here for follow up for emphysema. Pt states he is not wearing his cpap machine he states he hates it and he sleeps with his mouth open and has tried several masks and nothing is helping. Pt states that the trlegy inhaler is working okay for him. And albuterol as needed     Referring provider: Dois Davenport, MD  HPI:   66 y.o. with severe COPD (FEV1 mid 20s 2018) and chronic hypoxemic respiratory failure whom I am seeing in follow up for COPD.    Doing ok. Adherent to Trelegy. Breathing stable.  Feels like Trelegy while still helpful is not quite given the same booster benefit as it was in the beginning.  Has a bit more congestion.  He feels like originating from his sinuses are nasal passages.  Not so much from the chest is much as upper airway.  PMH: COPD, HTN Surgical History: LHC, none otherwise Family History: CAD, DM, HTN in mother Social History: former smoker, 60 pack years, retired   ACT:      No data to display          MMRC: mMRC Dyspnea Scale mMRC Score  06/01/2020  1:32 PM 3    Epworth:     04/18/2018    2:00 PM  Results of the Epworth flowsheet  Sitting and reading 0  Watching TV 1  Sitting, inactive in a public place (e.g. a theatre or a meeting) 0  As a passenger in a car for an hour without a break 0  Lying down to rest in the afternoon when circumstances permit 3  Sitting and talking to someone 0  Sitting quietly after a lunch without alcohol 0  In a car, while stopped for a few minutes in traffic 0  Total score 4    Tests:   FENO:  No results found for: "NITRICOXIDE"  PFT:    Latest Ref Rng & Units 06/20/2016    8:22 AM  PFT Results  FVC-Pre L 2.22   FVC-Predicted Pre % 51   FVC-Post L 2.62   FVC-Predicted Post % 60   Pre FEV1/FVC % % 28   Post FEV1/FCV % % 27   FEV1-Pre L 0.62    FEV1-Predicted Pre % 18   FEV1-Post L 0.70   DLCO uncorrected ml/min/mmHg 13.12   DLCO UNC% % 37   DLVA Predicted % 57   TLC L 8.07   TLC % Predicted % 109   RV % Predicted % 243   Personally reviewed and interpreted as severe obstruction, FVC suggestive of gas trapping or restrictive disease, TLC within normal limits, RV to TLC ratio confirms gas trapping, DLCO severely reduced  WALK:     08/01/2018   11:09 AM 02/10/2018    3:22 PM 12/11/2017    3:17 PM 12/11/2017    3:02 PM 03/01/2017   11:37 AM  SIX MIN WALK  Supplimental Oxygen during Test? (L/min) Yes  Yes  Yes  O2 Flow Rate 3 L/min    3 L/min  Type Pulse  Continuous  Continuous  Tech Comments: Patient was able to complete walk at a slower than usual pace. Started the walk with SOB that increased during the walk. Denied any CP or body aches. Just SOB and fatigue. Patient was placed back on his  3L pulsed O2.  Patient when ambulation needs 3 L continuous to maintain oxygen saturations, patient on 5 pulsed drops to 86%, patient short of breath while walking//bjt Completed first lap with mild SOB and PO2 89% - added 2L O2 - On second lap PO2 89-90% with SOB noted. Increased to 3L O2 to finish walk with PO2 remaining at 94%. Karlton Lemon RN  concluded at lap 2 due to increased SOB and PO2 87% - Karlton Lemon RN desat during 6 minute walk, placed on 2:, dropped after 2nd lap, 3L set at kept pt at 93% TA/CMA    Imaging: Personally reviewed  Lab Results: Personally reviewed CBC    Component Value Date/Time   WBC 9.2 07/19/2016 0822   RBC 4.98 07/19/2016 0822   HGB 15.3 07/19/2016 0822   HCT 45.0 07/19/2016 0822   PLT 279 07/19/2016 0822   MCV 90 07/19/2016 0822   MCH 30.7 07/19/2016 0822   MCHC 34.0 07/19/2016 0822   RDW 13.6 07/19/2016 0822   LYMPHSABS 3.1 07/19/2016 0822   EOSABS 0.1 07/19/2016 0822   BASOSABS 0.0 07/19/2016 0822    BMET    Component Value Date/Time   NA 138 08/14/2017 1054   K 4.4 08/14/2017 1054    CL 100 08/14/2017 1054   CO2 26 08/14/2017 1054   GLUCOSE 93 08/14/2017 1054   BUN 14 08/14/2017 1054   CREATININE 1.10 08/14/2017 1054   CALCIUM 9.3 08/14/2017 1054   GFRNONAA 73 08/14/2017 1054   GFRAA 84 08/14/2017 1054    BNP No results found for: "BNP"  ProBNP    Component Value Date/Time   PROBNP 33 06/15/2016 1039    Specialty Problems       Pulmonary Problems   COPD (chronic obstructive pulmonary disease) (HCC)    April 2018 alpha-1 antitrypsin normal      Chronic respiratory failure, unsp w hypoxia or hypercapnia (HCC)   OSA (obstructive sleep apnea)    No Known Allergies  Immunization History  Administered Date(s) Administered   Pneumococcal Conjugate-13 10/12/2016   Pneumococcal Polysaccharide-23 12/11/2017    Past Medical History:  Diagnosis Date   COPD (chronic obstructive pulmonary disease) (HCC) 06/01/2016   Greater than 50-pack-year smoking history   Hyperlipidemia    Hypertension     Tobacco History: Social History   Tobacco Use  Smoking Status Former   Packs/day: 1.50   Years: 40.00   Total pack years: 60.00   Types: Cigarettes   Start date: 12/01/1972   Quit date: 03/12/2012   Years since quitting: 9.9  Smokeless Tobacco Never   Counseling given: Not Answered   Continue to not smoke  Outpatient Encounter Medications as of 02/13/2022  Medication Sig   albuterol (PROAIR HFA) 108 (90 Base) MCG/ACT inhaler Inhale 1-2 puffs into the lungs every 6 (six) hours as needed for wheezing or shortness of breath.   albuterol (PROVENTIL) (2.5 MG/3ML) 0.083% nebulizer solution INHALE 3 ML BY NEBULIZATION EVERY 6 HOURS AS NEEDED FOR WHEEZING OR SHORTNESS OF BREATH   ARIPiprazole (ABILIFY) 2 MG tablet Take 2 mg by mouth daily.   aspirin EC 81 MG tablet Take 1 tablet (81 mg total) by mouth daily.   atorvastatin (LIPITOR) 40 MG tablet TAKE 1 TABLET BY MOUTH EVERY DAY   citalopram (CELEXA) 20 MG tablet Take 20 mg by mouth daily.   citalopram  (CELEXA) 40 MG tablet Take 40 mg by mouth every morning.   divalproex (DEPAKOTE ER) 250 MG 24 hr tablet Take  250 mg by mouth daily.   ezetimibe (ZETIA) 10 MG tablet TAKE 1 TABLET BY MOUTH EVERY DAY   fluticasone (FLONASE) 50 MCG/ACT nasal spray Place 2 sprays into both nostrils daily.   Fluticasone-Umeclidin-Vilant (TRELEGY ELLIPTA) 100-62.5-25 MCG/ACT AEPB Inhale 1 puff into the lungs daily.   hydrochlorothiazide (MICROZIDE) 12.5 MG capsule Take 1 capsule (12.5 mg total) by mouth daily. Please make overdue appt with Dr. Katrinka Blazing before anymore refills. Thank you 3rd and Final attempt   loratadine (CLARITIN) 10 MG tablet Take 10 mg by mouth daily.   metFORMIN (GLUCOPHAGE-XR) 500 MG 24 hr tablet Take 500 mg by mouth daily.   metoprolol succinate (TOPROL-XL) 100 MG 24 hr tablet Take 1 tablet (100 mg total) by mouth daily. Take with or immediately following a meal. Overdue follow up appt must be scheduled for further refills   nitroGLYCERIN (NITROSTAT) 0.4 MG SL tablet 1 tab sublingual for significant dyspnea, may repeat every 5 minutes x2 if not relieved   valsartan (DIOVAN) 160 MG tablet Take 160 mg by mouth daily.   valsartan (DIOVAN) 80 MG tablet TAKE 1 TABLET BY MOUTH EVERY DAY   Vitamin D, Ergocalciferol, (DRISDOL) 1.25 MG (50000 UT) CAPS capsule TAKE 1 CAPSULE BY MOUTH ONE TIME PER WEEK   [DISCONTINUED] doxycycline (VIBRA-TABS) 100 MG tablet Take 1 tablet (100 mg total) by mouth 2 (two) times daily.   No facility-administered encounter medications on file as of 02/13/2022.     Review of Systems  Review of Systems  n/a Physical Exam  Pulse 85   Ht 6' (1.829 m)   Wt 204 lb (92.5 kg)   SpO2 96%   BMI 27.67 kg/m   Wt Readings from Last 5 Encounters:  02/13/22 204 lb (92.5 kg)  03/07/21 214 lb (97.1 kg)  06/01/20 204 lb (92.5 kg)  02/18/20 203 lb (92.1 kg)  12/22/19 207 lb 12.8 oz (94.3 kg)    BMI Readings from Last 5 Encounters:  02/13/22 27.67 kg/m  03/07/21 29.02 kg/m   06/01/20 27.67 kg/m  02/18/20 27.53 kg/m  12/22/19 28.18 kg/m     Physical Exam General: Well-appearing, in wheelchair Eyes: EOMI, no icterus Neck: Supple no JVP Respiratory: Clear to auscultation bilaterally, distant lung sounds, no wheezing, normal work of breathing on nasal cannula MSK: No synovitis, no joint effusion Neuro: No weakness, sensation intact Psych: Normal mood, full affect   Assessment & Plan:   66 y.o. man with severe COPD whom we are seeing in follow up for COPD, DOE.   Severe COPD/asthma overlap: FEV1 mid 20s with significant bronchodilator response and 2018. No recent exacerbations.  Will escalate Trelegy from 100 mcg dose to high-dose, 200 mcg dose and see if shortness of breath any better.  He has on hand the course of doxycycline and prednisone to be used if he has exacerbation, to contact me if that is the case.   Chronic hypoxic respiratory failure: He is on 3 L via portable oxygen concentrator.  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  DOE: COPD, gas trapping, deconditioning.  Encouraged graduated exercise program at home.  Escalation of Trelegy as above.   Return in about 6 months (around 08/15/2022).   Karren Burly, MD 02/13/2022

## 2022-02-27 NOTE — Telephone Encounter (Signed)
Could try Combivent 2 puffs as needed. Will need to load canister and prime as is a Respimat device.

## 2022-02-28 MED ORDER — COMBIVENT RESPIMAT 20-100 MCG/ACT IN AERS: 1.0000 | INHALATION_SPRAY | Freq: Four times a day (QID) | RESPIRATORY_TRACT | 1 refills | Status: DC

## 2022-03-03 ENCOUNTER — Other Ambulatory Visit: Payer: Self-pay | Admitting: Pulmonary Disease

## 2022-04-13 ENCOUNTER — Encounter: Payer: Self-pay | Admitting: Pulmonary Disease

## 2022-04-16 NOTE — Telephone Encounter (Signed)
Mychart message sent by pt: Glenn Allison [facility] (supporting Lanier Clam, MD)3 days ago    Hi Dr Silas Flood  I used the [employer] inhaler as instructed by your nurse. The inhaler was easy to use but it didn't give me the expected results. I began not being able to sleep as I was prior to taking it. I also developed more mucus than usual. I don't like the effects of combivent so if we could try something else I'd be grateful.   Glenn Allison [phone]    Dr. Silas Flood, please advise.

## 2022-04-17 NOTE — Telephone Encounter (Signed)
Dr. Silas Flood, please advise on pt's message. He would like to try Levalbuterol. Please advise on the frequency and strength. Thanks!

## 2022-04-18 MED ORDER — LEVALBUTEROL TARTRATE 45 MCG/ACT IN AERO: 2.0000 | INHALATION_SPRAY | RESPIRATORY_TRACT | 12 refills | Status: DC | PRN

## 2022-05-08 ENCOUNTER — Other Ambulatory Visit: Payer: Self-pay | Admitting: Pulmonary Disease

## 2022-06-21 ENCOUNTER — Telehealth: Payer: Self-pay

## 2022-06-21 MED ORDER — TRELEGY ELLIPTA 100-62.5-25 MCG/ACT IN AEPB: 1.0000 | INHALATION_SPRAY | Freq: Every day | RESPIRATORY_TRACT | 11 refills | Status: DC

## 2022-06-21 NOTE — Telephone Encounter (Signed)
PA renewal request received via CMM for Trelegy Ellipta 100-62.5-25MCG/ACT aerosol powder  PA has been submitted to [employer] and is pending determination  Key: [id number]

## 2022-06-21 NOTE — Telephone Encounter (Signed)
Prior auth team called the office stating that the Trelegy does not require a PA and that the med will cost pt around $5.00.   Called pt and let him know this info and he verbalized understanding. Nothing further needed.

## 2022-07-30 ENCOUNTER — Other Ambulatory Visit: Payer: Self-pay | Admitting: Pulmonary Disease

## 2022-07-30 NOTE — Telephone Encounter (Signed)
Medication not showing on current med list.  Please advise on refill request

## 2022-08-15 ENCOUNTER — Other Ambulatory Visit: Payer: Self-pay | Admitting: Pulmonary Disease

## 2022-09-16 ENCOUNTER — Other Ambulatory Visit: Payer: Self-pay | Admitting: Pulmonary Disease

## 2022-09-18 ENCOUNTER — Telehealth: Payer: Self-pay | Admitting: Pulmonary Disease

## 2022-09-18 MED ORDER — LEVALBUTEROL TARTRATE 45 MCG/ACT IN AERO: INHALATION_SPRAY | RESPIRATORY_TRACT | 12 refills | Status: DC

## 2022-09-18 MED ORDER — TRELEGY ELLIPTA 100-62.5-25 MCG/ACT IN AEPB: 1.0000 | INHALATION_SPRAY | Freq: Every day | RESPIRATORY_TRACT | 11 refills | Status: DC

## 2022-09-18 NOTE — Telephone Encounter (Signed)
Wants to change his pharm to the [facility] on [facility].  He needs Trelegy & XOPENEX   Any questions call [phone]

## 2022-09-18 NOTE — Telephone Encounter (Signed)
Sent Trelegy & XOPENEX to the pharmacy of pt's choice. Nothing further needed. Pt is also aware.

## 2022-10-16 ENCOUNTER — Encounter: Payer: Self-pay | Admitting: Pulmonary Disease

## 2022-10-16 ENCOUNTER — Telehealth: Payer: Medicare Other | Admitting: Pulmonary Disease

## 2022-10-16 DIAGNOSIS — J432 Centrilobular emphysema: Secondary | ICD-10-CM | POA: Diagnosis not present

## 2022-10-16 DIAGNOSIS — J9610 Chronic respiratory failure, unspecified whether with hypoxia or hypercapnia: Secondary | ICD-10-CM

## 2022-10-16 MED ORDER — ALBUTEROL SULFATE (2.5 MG/3ML) 0.083% IN NEBU: 2.5000 mg | INHALATION_SOLUTION | Freq: Four times a day (QID) | RESPIRATORY_TRACT | 12 refills | Status: DC | PRN

## 2022-10-16 MED ORDER — DOXYCYCLINE HYCLATE 100 MG PO TABS: 100.0000 mg | ORAL_TABLET | Freq: Two times a day (BID) | ORAL | 0 refills | Status: DC

## 2022-10-16 MED ORDER — PREDNISONE 20 MG PO TABS: ORAL_TABLET | ORAL | 0 refills | Status: AC

## 2022-10-16 NOTE — Patient Instructions (Addendum)
Prednisone 40 mg for 5 days and 20 mg for 5 days and stop  Doxycycline 1 tablet twice a day for 7 days then stop  Okay to use montelukast  Refilled albuterol nebulizer solution  Return to clinic in 3 months or sooner as needed with Dr. Judeth Horn

## 2022-10-16 NOTE — Progress Notes (Signed)
Virtual Visit via Video Note  I connected with Glenn Allison on 10/16/22 at  4:00 PM EDT by a video enabled telemedicine application and verified that I am speaking with the correct person using two identifiers.  Location: Patient: Home Provider: Office - [facility] - [address] [place], [address], [place], [place] [address]  I discussed the limitations of evaluation and management by telemedicine and the availability of in person appointments. The patient expressed understanding and agreed to proceed. I also discussed with the patient that there may be a patient responsible charge related to this service. The patient expressed understanding and agreed to proceed.  Patient consented to consult via telephone: Yes People present and their role in pt care: Pt   History of Present Illness:  67 year old man with COPD and chronic hypoxemia presents for follow-up concern for her chief complaint of worsening congestion.  Worsening chest congestion for the last 3 to 4 weeks.  Coughing little bit more.  Little more dyspnea.  Oxygenation seems relatively stable but dropping a little bit more than normal.  No fever or chills.  Unclear etiology.  Denies significant respiratory issues, URI symptoms etc.   Chief complaint:  Increased chest congestion  Observations/Objective:  Sitting up in chair, breathing comfortably, speaking in full sentences without issue  Social History   Tobacco Use  Smoking Status Former   Current packs/day: 0.00   Average packs/day: 1.5 packs/day for 40.0 years (60.0 ttl pk-yrs)   Types: Cigarettes   Start date: 12/01/1972   Quit date: 03/12/2012   Years since quitting: 10.6  Smokeless Tobacco Never   Immunization History  Administered Date(s) Administered   Pneumococcal Conjugate-13 10/12/2016   Pneumococcal Polysaccharide-23 12/11/2017    Assessment and Plan:  COPD with acute exacerbation: Worsening congestion over the last 3 to 4 weeks.  Mild increase in  dyspnea.  Oxygenation seems relatively stable. -- Prednisone 40 mg for 5 days and 20 mg for 5 days then stop, doxycycline 1 tab twice daily for 7 days then stop -- Continue all inhaled therapies-Trelegy and Xopenex as needed, albuterol nebulizer solution refilled today  Chronic hypoxemic respiratory failure: Continue oxygen supplementation   Follow Up Instructions:  Return in about 3 months (around 01/16/2023).    I discussed the assessment and treatment plan with the patient. The patient was provided an opportunity to ask questions and all were answered. The patient agreed with the plan and demonstrated an understanding of the instructions.   The patient was advised to call back or seek an in-person evaluation if the symptoms worsen or if the condition fails to improve as anticipated.    Karren Burly, MD

## 2022-11-23 ENCOUNTER — Other Ambulatory Visit: Payer: Self-pay | Admitting: Pulmonary Disease

## 2022-12-14 ENCOUNTER — Encounter: Payer: Self-pay | Admitting: Pulmonary Disease

## 2022-12-14 NOTE — Telephone Encounter (Signed)
Can we submit PA for levalbuterol hfa?  He has failed ipratropium and albuterol due to lack of effectiveness. Thanks!

## 2022-12-14 NOTE — Telephone Encounter (Signed)
Please see mychart message sent by pt and advise. 

## 2022-12-18 ENCOUNTER — Other Ambulatory Visit (HOSPITAL_COMMUNITY): Payer: Self-pay

## 2022-12-18 NOTE — Telephone Encounter (Signed)
PA is not needed, per test claim through [facility] pharmacies- $4.60

## 2023-02-28 ENCOUNTER — Encounter: Payer: Self-pay | Admitting: Pulmonary Disease

## 2023-02-28 NOTE — Telephone Encounter (Signed)
Can we do prior auth for levalbuterol? He has failed aalbuterol, ipratropium, and combivent. Nebulizers are not acceptable alternative - he needs a rescue inhaler to have when he is not home or does not have access to a nebulizer machine.

## 2023-03-04 ENCOUNTER — Telehealth: Payer: Self-pay

## 2023-03-04 ENCOUNTER — Other Ambulatory Visit (HOSPITAL_COMMUNITY): Payer: Self-pay

## 2023-03-04 NOTE — Telephone Encounter (Signed)
Test claim shows that Levalbuterol HFA (generic) does not require a prior authorization and has a co-pay of $4.60. Brand name is not covered.

## 2023-03-04 NOTE — Telephone Encounter (Signed)
Pharmacy Patient Advocate Encounter   Received notification from RX Request Messages that prior authorization for Levalbuterol HFA is required/requested.   Insurance verification completed.   The patient is insured through  [employer]  .   Per test claim: The current 30 day co-pay is, $4.60.  No PA needed at this time. This test claim was processed through [facility]- copay amounts may vary at other pharmacies due to pharmacy/plan contracts, or as the patient moves through the different stages of their insurance plan.

## 2023-04-23 ENCOUNTER — Other Ambulatory Visit (HOSPITAL_COMMUNITY): Payer: Self-pay

## 2023-04-23 ENCOUNTER — Telehealth: Payer: Self-pay

## 2023-04-23 NOTE — Telephone Encounter (Signed)
*  Pulm  Pharmacy Patient Advocate Encounter   Received notification from Fax that prior authorization for Levalbuterol Tartrate 45MCG/ACT aerosol  is required/requested.   Insurance verification completed.   The patient is insured through [facility] [place] .   Per test claim: PA required; PA submitted to above mentioned insurance via CoverMyMeds Key/confirmation #/EOC [id number] Status is pending

## 2023-04-23 NOTE — Telephone Encounter (Signed)
Pharmacy Patient Advocate Encounter  Received notification from [facility] that Prior Authorization for Levalbuterol HFA has been APPROVED from 04/23/2023 to 03/11/2098. Ran test claim, Copay is $4.80. This test claim was processed through [facility]- copay amounts may vary at other pharmacies due to pharmacy/plan contracts, or as the patient moves through the different stages of their insurance plan.

## 2023-06-11 ENCOUNTER — Encounter: Payer: Self-pay | Admitting: *Deleted

## 2023-07-24 ENCOUNTER — Other Ambulatory Visit: Payer: Self-pay | Admitting: Pulmonary Disease

## 2023-09-03 ENCOUNTER — Other Ambulatory Visit: Payer: Self-pay | Admitting: Pulmonary Disease

## 2023-09-03 ENCOUNTER — Encounter: Payer: Self-pay | Admitting: Adult Health

## 2023-09-03 ENCOUNTER — Telehealth (INDEPENDENT_AMBULATORY_CARE_PROVIDER_SITE_OTHER): Admitting: Adult Health

## 2023-09-03 DIAGNOSIS — J441 Chronic obstructive pulmonary disease with (acute) exacerbation: Secondary | ICD-10-CM

## 2023-09-03 DIAGNOSIS — G4733 Obstructive sleep apnea (adult) (pediatric): Secondary | ICD-10-CM | POA: Diagnosis not present

## 2023-09-03 DIAGNOSIS — J432 Centrilobular emphysema: Secondary | ICD-10-CM | POA: Diagnosis not present

## 2023-09-03 DIAGNOSIS — J9610 Chronic respiratory failure, unspecified whether with hypoxia or hypercapnia: Secondary | ICD-10-CM | POA: Diagnosis not present

## 2023-09-03 DIAGNOSIS — Z87891 Personal history of nicotine dependence: Secondary | ICD-10-CM

## 2023-09-03 MED ORDER — PREDNISONE 20 MG PO TABS: 20.0000 mg | ORAL_TABLET | Freq: Every day | ORAL | 0 refills | Status: AC

## 2023-09-03 MED ORDER — DOXYCYCLINE HYCLATE 100 MG PO TABS: 100.0000 mg | ORAL_TABLET | Freq: Two times a day (BID) | ORAL | 0 refills | Status: DC

## 2023-09-03 NOTE — Progress Notes (Signed)
 Virtual Visit via Video Note  I connected with Glenn Allison on 09/03/23 at  3:30 PM EDT by a video enabled telemedicine application and verified that I am speaking with the correct person using two identifiers.  Location: Patient: Home  Provider: Office    I discussed the limitations of evaluation and management by telemedicine and the availability of in person appointments. The patient expressed understanding and agreed to proceed.  History of Present Illness: 68 year old male former smoker followed for COPD with emphysema and respiratory failure on home oxygen  Today's video visit for an acute office visit .  She has had increased cough and congestion.  Feels that his breathing has not been doing as well.  He has very severe COPD with emphysema.  He is on Trelegy daily.  Takes Mucinex most days.  He does use Xopenex  inhaler and albuterol  nebs as needed.  He is on oxygen 3 L.  Says her oxygen's at rest stay above 90% he does drop his oxygen if he walks for any amount of distance.  He denies any hemoptysis, chest pain, orthopnea.  Appetite is good.  No nausea vomiting or diarrhea.  Patient lives at home does have family members that check on him. Has not been on antibiotics or steroids for greater than 6 months.  Past Medical History:  Diagnosis Date   COPD (chronic obstructive pulmonary disease) (HCC) 06/01/2016   Greater than 50-pack-year smoking history   Hyperlipidemia    Hypertension    .   Observations/Objective: Appears in no acute distress.  Sitting in his chair on oxygen.  Current O2 saturation is 94% on 3 L  Assessment and Plan: Acute COPD exacerbation.  Will treat with doxycycline  x 1 week and prednisone  40 mg for 5 days.  Continue on his current maintenance regimen with Trelegy inhaler.  May use Xopenex  inhaler or albuterol  nebulizer as needed.  Chronic respiratory failure.  Continue on oxygen to keep O2 saturations greater than 88%.  Continue on 3 L at rest and would  increase to 4 L with activity.  Obstructive sleep.  CPAP intolerant.  Patient says he is not using CPAP.  Plan  Patient Instructions  Begin doxycycline  100 mg twice daily, take with food Prednisone  20 mg daily for 5 days Mucinex DM twice daily as needed for cough and congestion Xopenex  inhaler or albuterol  nebulizer as needed Continue on Oxygen 3 L at rest.  May increase oxygen 4 L with activity to keep O2 saturations greater than 88 to 90% Follow-up with Dr. Annella in 3 months and as needed Please contact office for sooner follow up if symptoms do not improve or worsen or seek emergency care      Follow Up Instructions:    I discussed the assessment and treatment plan with the patient. The patient was provided an opportunity to ask questions and all were answered. The patient agreed with the plan and demonstrated an understanding of the instructions.   The patient was advised to call back or seek an in-person evaluation if the symptoms worsen or if the condition fails to improve as anticipated.  I provided 30 minutes of non-face-to-face time during this encounter.   Madelin Stank, NP

## 2023-09-03 NOTE — Patient Instructions (Signed)
 Begin doxycycline  100 mg twice daily, take with food Prednisone  20 mg daily for 5 days Mucinex DM twice daily as needed for cough and congestion Xopenex  inhaler or albuterol  nebulizer as needed Continue on Oxygen 3 L at rest.  May increase oxygen 4 L with activity to keep O2 saturations greater than 88 to 90% Follow-up with Dr. Annella in 3 months and as needed Please contact office for sooner follow up if symptoms do not improve or worsen or seek emergency care

## 2023-09-03 NOTE — Telephone Encounter (Signed)
 Copied from CRM [id number]. Topic: Clinical - Medication Refill >> Sep 03, 2023  1:54 PM Dustin F wrote: Medication: predniSONE  (DELTASONE ) 20 MG tablet (shows discontinued, but the patient stated Dr. Annella told him he would fill it if he needed it.) & doxycycline  (VIBRA -TABS) 100 MG tablet  Has the patient contacted their pharmacy? Yes; pt needs new refills for both medications. (Agent: If no, request that the patient contact the pharmacy for the refill. If patient does not wish to contact the pharmacy document the reason why and proceed with request.) (Agent: If yes, when and what did the pharmacy advise?)  This is the patient's preferred pharmacy:  [id number] [place], [place] - [address] AT [facility] [address] [place] [address] Phone: [phone] Fax: [phone]  Is this the correct pharmacy for this prescription? Yes If no, delete pharmacy and type the correct one.   Has the prescription been filled recently? No; doxycycline  (VIBRA -TABS) 100 MG tablet was filled on 10/16/2022 and predniSONE  (DELTASONE ) 20 MG tablet was filled on 07/26/2021.  Is the patient out of the medication? Yes  Has the patient been seen for an appointment in the last year OR does the patient have an upcoming appointment? Yes  Can we respond through MyChart? Yes  Agent: Please be advised that Rx refills may take up to 3 business days. We ask that you follow-up with your pharmacy.

## 2023-09-19 ENCOUNTER — Other Ambulatory Visit: Payer: Self-pay

## 2023-09-19 DIAGNOSIS — J441 Chronic obstructive pulmonary disease with (acute) exacerbation: Secondary | ICD-10-CM

## 2023-09-23 ENCOUNTER — Other Ambulatory Visit: Payer: Self-pay | Admitting: Pulmonary Disease

## 2023-09-24 ENCOUNTER — Telehealth: Payer: Self-pay | Admitting: Pulmonary Disease

## 2023-09-24 ENCOUNTER — Other Ambulatory Visit: Payer: Self-pay | Admitting: Pulmonary Disease

## 2023-09-24 NOTE — Telephone Encounter (Signed)
 I will reach back out to [facility] from [employer] to check and follow up to see if office note from 6/24 will suffice and get back to you. Thank you.

## 2023-09-24 NOTE — Telephone Encounter (Signed)
 There is an office note 09/03/2023 that mentions use of nebulizer. Will this not suffice?

## 2023-09-24 NOTE — Telephone Encounter (Signed)
 Per Avelina from [employer],   We are needing a narrative entered into a recent provider note within the last 90 days mentioning the need for the neb please.

## 2023-09-24 NOTE — Telephone Encounter (Signed)
 Avelina from [employer] reviewed the 6/24 office visit and confirmed everything looks good. She'll be reaching out to the patient today to go over the delivery method and any co-pay details. Nothing further is needed on our end at this time, thank you.

## 2023-10-10 ENCOUNTER — Other Ambulatory Visit: Payer: Self-pay | Admitting: Pulmonary Disease

## 2023-10-17 ENCOUNTER — Other Ambulatory Visit: Payer: Self-pay | Admitting: Pulmonary Disease

## 2023-11-14 ENCOUNTER — Ambulatory Visit (INDEPENDENT_AMBULATORY_CARE_PROVIDER_SITE_OTHER): Admitting: Pulmonary Disease

## 2023-11-14 ENCOUNTER — Encounter: Payer: Self-pay | Admitting: Pulmonary Disease

## 2023-11-14 VITALS — BP 146/85 | HR 81 | Temp 98.7°F | Ht 72.0 in | Wt 220.0 lb

## 2023-11-14 DIAGNOSIS — J432 Centrilobular emphysema: Secondary | ICD-10-CM | POA: Diagnosis not present

## 2023-11-14 DIAGNOSIS — J9610 Chronic respiratory failure, unspecified whether with hypoxia or hypercapnia: Secondary | ICD-10-CM | POA: Diagnosis not present

## 2023-11-14 DIAGNOSIS — G4733 Obstructive sleep apnea (adult) (pediatric): Secondary | ICD-10-CM

## 2023-11-14 MED ORDER — TRELEGY ELLIPTA 200-62.5-25 MCG/ACT IN AEPB: 1.0000 | INHALATION_SPRAY | Freq: Every day | RESPIRATORY_TRACT | 12 refills | Status: AC

## 2023-11-14 MED ORDER — PREDNISONE 20 MG PO TABS: ORAL_TABLET | ORAL | 0 refills | Status: AC

## 2023-11-14 MED ORDER — FLUTICASONE PROPIONATE HFA 110 MCG/ACT IN AERO: 2.0000 | INHALATION_SPRAY | Freq: Every evening | RESPIRATORY_TRACT | 12 refills | Status: AC

## 2023-11-14 NOTE — Patient Instructions (Signed)
 Nice to see you again  Paperwork today for new medicine O2 very severe helped with your symptoms of shortness of breath about a day  I sent a new prescription for Trelegy high-dose steroid to see if this helps with your symptoms during the day and the cough at night  In addition, to help with the cough at night, I sent new prescription for Flovent  2 puffs in the evening  Rinse your mouth out thoroughly after using Trelegy and Flovent   I sent in a prescription to have a supply of prednisone  in case you need it  Return to clinic in 6 months or sooner as needed with Dr. Annella

## 2023-11-14 NOTE — Progress Notes (Unsigned)
 @Patient  ID: Glenn Allison, male    DOB: February 19, 1956, 68 y.o.   MRN: [id number]  No chief complaint on file.   Referring provider: Burney Darice CROME, MD  HPI:   PMH:  Smoker/ Smoking History:  Maintenance:   Pt of:   11/14/2023  - Visit     Questionaires / Pulmonary Flowsheets:   ACT:      No data to display          MMRC: mMRC Dyspnea Scale mMRC Score  06/01/2020  1:32 PM 3    Epworth:     04/18/2018    2:00 PM  Results of the Epworth flowsheet  Sitting and reading 0  Watching TV 1  Sitting, inactive in a public place (e.g. a theatre or a meeting) 0  As a passenger in a car for an hour without a break 0  Lying down to rest in the afternoon when circumstances permit 3  Sitting and talking to someone 0  Sitting quietly after a lunch without alcohol 0  In a car, while stopped for a few minutes in traffic 0  Total score 4    Tests:   FENO:  No results found for: NITRICOXIDE  PFT:    Latest Ref Rng & Units 06/20/2016    8:22 AM  PFT Results  FVC-Pre L 2.22   FVC-Predicted Pre % 51   FVC-Post L 2.62   FVC-Predicted Post % 60   Pre FEV1/FVC % % 28   Post FEV1/FCV % % 27   FEV1-Pre L 0.62   FEV1-Predicted Pre % 18   FEV1-Post L 0.70   DLCO uncorrected ml/min/mmHg 13.12   DLCO UNC% % 37   DLVA Predicted % 57   TLC L 8.07   TLC % Predicted % 109   RV % Predicted % 243     WALK:     08/01/2018   11:09 AM 02/10/2018    3:22 PM 12/11/2017    3:17 PM 12/11/2017    3:02 PM 03/01/2017   11:37 AM  SIX MIN WALK  Supplimental Oxygen during Test? (L/min) Yes  Yes  Yes  O2 Flow Rate 3 L/min    3 L/min  Type Pulse  Continuous  Continuous  Tech Comments: Patient was able to complete walk at a slower than usual pace. Started the walk with SOB that increased during the walk. Denied any CP or body aches. Just SOB and fatigue. Patient was placed back on his 3L pulsed O2.  Patient when ambulation needs 3 L continuous to maintain oxygen saturations, patient on 5  pulsed drops to 86%, patient short of breath while walking//bjt Completed first lap with mild SOB and PO2 89% - added 2L O2 - On second lap PO2 89-90% with SOB noted. Increased to 3L O2 to finish walk with PO2 remaining at 94%. Karna Doom RN  concluded at lap 2 due to increased SOB and PO2 87% - Karna Doom RN desat during 6 minute walk, placed on 2:, dropped after 2nd lap, 3L set at kept pt at 93% TA/CMA    Imaging: No results found.  Lab Results:  CBC    Component Value Date/Time   WBC 9.2 07/19/2016 0822   RBC 4.98 07/19/2016 0822   HGB 15.3 07/19/2016 0822   HCT 45.0 07/19/2016 0822   PLT 279 07/19/2016 0822   MCV 90 07/19/2016 0822   MCH 30.7 07/19/2016 0822   MCHC 34.0 07/19/2016 0822   RDW 13.6 07/19/2016  9177   LYMPHSABS 3.1 07/19/2016 0822   EOSABS 0.1 07/19/2016 0822   BASOSABS 0.0 07/19/2016 0822    BMET    Component Value Date/Time   NA 138 08/14/2017 1054   K 4.4 08/14/2017 1054   CL 100 08/14/2017 1054   CO2 26 08/14/2017 1054   GLUCOSE 93 08/14/2017 1054   BUN 14 08/14/2017 1054   CREATININE 1.10 08/14/2017 1054   CALCIUM  9.3 08/14/2017 1054   GFRNONAA 73 08/14/2017 1054   GFRAA 84 08/14/2017 1054    BNP No results found for: BNP  ProBNP    Component Value Date/Time   PROBNP 33 06/15/2016 1039    Specialty Problems       Pulmonary Problems   COPD (chronic obstructive pulmonary disease) (HCC)   April 2018 alpha-1 antitrypsin normal      Chronic respiratory failure, unsp w hypoxia or hypercapnia (HCC)   OSA (obstructive sleep apnea)    No Known Allergies  Immunization History  Administered Date(s) Administered   Pneumococcal Conjugate-13 10/12/2016   Pneumococcal Polysaccharide-23 12/11/2017    Past Medical History:  Diagnosis Date   COPD (chronic obstructive pulmonary disease) (HCC) 06/01/2016   Greater than 50-pack-year smoking history   Hyperlipidemia    Hypertension     Tobacco History: Social History   Tobacco  Use  Smoking Status Former   Current packs/day: 0.00   Average packs/day: 1.5 packs/day for 40.0 years (60.0 ttl pk-yrs)   Types: Cigarettes   Start date: 12/01/1972   Quit date: 03/12/2012   Years since quitting: 11.6  Smokeless Tobacco Never   Counseling given: Not Answered   Continue to not smoke  Outpatient Encounter Medications as of 11/14/2023  Medication Sig   albuterol  (PROVENTIL ) (2.5 MG/3ML) 0.083% nebulizer solution TAKE 3 MLS BY NEBULIZATION EVERY 6 HOURS AS NEEDED WHEEZING OR SHORTNESS OF BREATH   ARIPiprazole (ABILIFY) 2 MG tablet Take 2 mg by mouth daily.   aspirin  EC 81 MG tablet Take 1 tablet (81 mg total) by mouth daily.   atorvastatin  (LIPITOR) 40 MG tablet TAKE 1 TABLET BY MOUTH EVERY DAY   citalopram  (CELEXA ) 20 MG tablet Take 20 mg by mouth daily.   citalopram  (CELEXA ) 40 MG tablet Take 40 mg by mouth every morning.   clonazePAM  (KLONOPIN ) 0.5 MG tablet TAKE 1/2 TABLET BY MOUTH EVERY 12 HOURS AS NEEDED   divalproex (DEPAKOTE ER) 250 MG 24 hr tablet Take 250 mg by mouth daily.   ezetimibe  (ZETIA ) 10 MG tablet TAKE 1 TABLET BY MOUTH EVERY DAY   fluticasone  (FLONASE ) 50 MCG/ACT nasal spray Place 2 sprays into both nostrils daily.   fluticasone  (FLOVENT  HFA) 110 MCG/ACT inhaler Inhale 2 puffs into the lungs every evening.   Fluticasone -Umeclidin-Vilant (TRELEGY ELLIPTA ) 200-62.5-25 MCG/ACT AEPB Inhale 1 puff into the lungs daily.   hydrochlorothiazide  (MICROZIDE ) 12.5 MG capsule Take 1 capsule (12.5 mg total) by mouth daily. Please make overdue appt with Dr. Claudene before anymore refills. Thank you 3rd and Final attempt   levalbuterol  (XOPENEX  HFA) 45 MCG/ACT inhaler INHALE 2 PUFFS INTO THE LUNGS EVERY 4 HOURS AS NEEDED FOR WHEEZING   loratadine (CLARITIN) 10 MG tablet Take 10 mg by mouth daily.   metFORMIN (GLUCOPHAGE-XR) 500 MG 24 hr tablet Take 500 mg by mouth daily.   metoprolol  succinate (TOPROL -XL) 100 MG 24 hr tablet Take 1 tablet (100 mg total) by mouth daily.  Take with or immediately following a meal. Overdue follow up appt must be scheduled for further refills  montelukast (SINGULAIR) 10 MG tablet TAKE 1 TABLET BY MOUTH EVERYDAY AT BEDTIME   nitroGLYCERIN  (NITROSTAT ) 0.4 MG SL tablet 1 tab sublingual for significant dyspnea, may repeat every 5 minutes x2 if not relieved   predniSONE  (DELTASONE ) 20 MG tablet Take 1 tablet (20 mg total) by mouth daily with breakfast.   predniSONE  (DELTASONE ) 20 MG tablet Take 2 tablets (40 mg total) by mouth daily with breakfast for 5 days, THEN 1 tablet (20 mg total) daily with breakfast for 5 days.   valsartan  (DIOVAN ) 160 MG tablet Take 160 mg by mouth daily.   valsartan  (DIOVAN ) 80 MG tablet TAKE 1 TABLET BY MOUTH EVERY DAY   [DISCONTINUED] doxycycline  (VIBRA -TABS) 100 MG tablet Take 1 tablet (100 mg total) by mouth 2 (two) times daily.   [DISCONTINUED] doxycycline  (VIBRA -TABS) 100 MG tablet Take 1 tablet (100 mg total) by mouth 2 (two) times daily.   [DISCONTINUED] TRELEGY ELLIPTA  100-62.5-25 MCG/ACT AEPB INHALE 1 PUFF INTO THE LUNGS DAILY   Vitamin D, Ergocalciferol, (DRISDOL) 1.25 MG (50000 UT) CAPS capsule TAKE 1 CAPSULE BY MOUTH ONE TIME PER WEEK (Patient not taking: Reported on 11/14/2023)   No facility-administered encounter medications on file as of 11/14/2023.     Review of Systems  Review of Systems   Physical Exam  BP (!) 146/85   Pulse 81   Temp 98.7 F (37.1 C) (Temporal)   Ht 6' (1.829 m)   Wt 220 lb (99.8 kg)   SpO2 (!) 84%   BMI 29.84 kg/m   Wt Readings from Last 5 Encounters:  11/14/23 220 lb (99.8 kg)  02/13/22 204 lb (92.5 kg)  03/07/21 214 lb (97.1 kg)  06/01/20 204 lb (92.5 kg)  02/18/20 203 lb (92.1 kg)    BMI Readings from Last 5 Encounters:  11/14/23 29.84 kg/m  02/13/22 27.67 kg/m  03/07/21 29.02 kg/m  06/01/20 27.67 kg/m  02/18/20 27.53 kg/m     Physical Exam    Assessment & Plan:   COPD with acute exacerbation: Worsening congestion over the last 3 to 4  weeks.  Mild increase in dyspnea.  Oxygenation seems relatively stable. -- Prednisone  40 mg for 5 days and 20 mg for 5 days then stop to on hand in case of exacerbation -- Continue all inhaled therapies -- Nocturnal Flovent  2 puffs in the evening for nocturnal mucus production and cough --New prescription for Ohtuvayre    Chronic hypoxemic respiratory failure: Continue oxygen supplementation   Return in about 6 months (around 05/13/2024) for f/u Dr. Annella.   Donnice JONELLE Annella, MD 11/14/2023   This appointment required *** minutes of patient care (this includes precharting, chart review, review of results, face-to-face care, etc.).

## 2023-11-18 ENCOUNTER — Telehealth: Payer: Self-pay

## 2023-11-18 NOTE — Telephone Encounter (Signed)
 Received Ohtuvayre  new start paperwork. Completed form and faxed with clinicals and insurance card copy to [facility]   Phone#: [phone] Fax#: [phone]

## 2023-11-28 ENCOUNTER — Encounter: Payer: Self-pay | Admitting: Pulmonary Disease

## 2023-11-29 ENCOUNTER — Telehealth: Payer: Self-pay

## 2023-12-03 ENCOUNTER — Telehealth: Payer: Self-pay

## 2023-12-03 ENCOUNTER — Other Ambulatory Visit (HOSPITAL_COMMUNITY): Payer: Self-pay

## 2023-12-03 NOTE — Telephone Encounter (Signed)
*  Pulm  Pharmacy Patient Advocate Encounter   Received notification from Fax that prior authorization for Fluticasone /Salmeterol HFA is required/requested.   Insurance verification completed.   The patient is insured through [employer] .   Per test claim: PA required; PA started via CoverMyMeds. KEY [id number] . Please see clinical question(s) below that I am not finding the answer to in their chart and advise.   Test claims showing patient is also on Trelegy which could be part of the problem with the Flovent  HFA. Last filled Trelegy 09/04

## 2023-12-05 NOTE — Telephone Encounter (Signed)
 Called [employer] to get an update on status of application. They stated they never received the patient's enrollment form despite us  getting a fax confirmation. Refaxed all documents to [employer].

## 2023-12-06 NOTE — Telephone Encounter (Signed)
 Received fax from [employer] confirming receipt of enrollment form.  Patient ID: [id number]

## 2023-12-10 ENCOUNTER — Other Ambulatory Visit: Payer: Self-pay | Admitting: Pulmonary Disease

## 2023-12-11 NOTE — Telephone Encounter (Signed)
 Received fax from [employer] with summary of benefits. Referral form for Ohtuvayre  received. Rx will be triaged to [facility].. Once benefits investigation completed, pharmacy will reach out the patient to schedule shipment. If medication is unaffordable, patient will need to express financial hardship to be referred back to [place] [facility] for patient assistance program pre-screening.   Patient ID: [id number] Pharmacy phone: [phone] [place] [facility] Phone#: [phone]

## 2023-12-12 ENCOUNTER — Other Ambulatory Visit (HOSPITAL_COMMUNITY): Payer: Self-pay

## 2023-12-12 NOTE — Telephone Encounter (Signed)
 It looks like pt should be on flovent  and trelegy  I am not sure how we ended up getting PA request for fluticasone /salmeterol hfa Also, I am not sure what clinical questions we need to answer

## 2023-12-16 LAB — COLOGUARD: COLOGUARD: NEGATIVE

## 2023-12-18 NOTE — Telephone Encounter (Signed)
 Patient no longer taking Fluticasone -Salmeterol (Advair HFA) Nothing further needed.

## 2024-02-12 ENCOUNTER — Encounter: Payer: Self-pay | Admitting: Pulmonary Disease

## 2024-02-12 ENCOUNTER — Other Ambulatory Visit: Payer: Self-pay | Admitting: Adult Health

## 2024-02-13 NOTE — Telephone Encounter (Signed)
 NFN

## 2024-02-26 ENCOUNTER — Telehealth: Payer: Self-pay

## 2024-02-26 MED ORDER — DOXYCYCLINE HYCLATE 100 MG PO TABS: 100.0000 mg | ORAL_TABLET | Freq: Two times a day (BID) | ORAL | 0 refills | Status: AC

## 2024-02-26 NOTE — Telephone Encounter (Signed)
 It seems he can not access flovent  HFA - I cannot remember if that is no longer being manufactured. Can you please proved most cost effective ICS inhaler? Thanks!
